# Patient Record
Sex: Female | Born: 1955 | Race: White | Hispanic: No | Smoking: Current every day smoker
Health system: Southern US, Community
[De-identification: ages and names within clinical notes are randomized; demographics above are authoritative.]

## PROBLEM LIST (undated history)

## (undated) DIAGNOSIS — F329 Major depressive disorder, single episode, unspecified: Secondary | ICD-10-CM

## (undated) DIAGNOSIS — K5792 Diverticulitis of intestine, part unspecified, without perforation or abscess without bleeding: Secondary | ICD-10-CM

## (undated) DIAGNOSIS — M545 Other chronic pain: Secondary | ICD-10-CM

## (undated) DIAGNOSIS — E7800 Pure hypercholesterolemia, unspecified: Secondary | ICD-10-CM

## (undated) DIAGNOSIS — J449 Chronic obstructive pulmonary disease, unspecified: Secondary | ICD-10-CM

## (undated) DIAGNOSIS — D49519 Neoplasm of unspecified behavior of unspecified kidney: Secondary | ICD-10-CM

## (undated) DIAGNOSIS — G8929 Other chronic pain: Secondary | ICD-10-CM

## (undated) DIAGNOSIS — N390 Urinary tract infection, site not specified: Secondary | ICD-10-CM

## (undated) DIAGNOSIS — F32A Depression, unspecified: Secondary | ICD-10-CM

## (undated) DIAGNOSIS — K219 Gastro-esophageal reflux disease without esophagitis: Secondary | ICD-10-CM

## (undated) HISTORY — DX: Major depressive disorder, single episode, unspecified: F32.9

## (undated) HISTORY — DX: Low back pain: M54.5

## (undated) HISTORY — DX: Other chronic pain: M54.50

## (undated) HISTORY — DX: Gastro-esophageal reflux disease without esophagitis: K21.9

## (undated) HISTORY — DX: Pure hypercholesterolemia, unspecified: E78.00

## (undated) HISTORY — PX: OVARIAN CYST REMOVAL: SHX89

## (undated) HISTORY — DX: Other chronic pain: G89.29

## (undated) HISTORY — DX: Chronic obstructive pulmonary disease, unspecified: J44.9

## (undated) HISTORY — DX: Depression, unspecified: F32.A

## (undated) HISTORY — DX: Diverticulitis of intestine, part unspecified, without perforation or abscess without bleeding: K57.92

## (undated) HISTORY — DX: Urinary tract infection, site not specified: N39.0

## (undated) HISTORY — PX: APPENDECTOMY: SHX54

## (undated) HISTORY — PX: CYSTECTOMY: SUR359

## (undated) HISTORY — PX: TUBAL LIGATION: SHX77

## (undated) HISTORY — DX: Neoplasm of unspecified behavior of unspecified kidney: D49.519

## (undated) HISTORY — PX: PARTIAL COLECTOMY: SHX5273

---

## 2006-07-16 ENCOUNTER — Encounter: Admission: RE | Admit: 2006-07-16 | Discharge: 2006-08-23 | Payer: Self-pay | Admitting: Family Medicine

## 2006-07-23 ENCOUNTER — Emergency Department (HOSPITAL_COMMUNITY): Admission: EM | Admit: 2006-07-23 | Discharge: 2006-07-23 | Payer: Self-pay | Admitting: Emergency Medicine

## 2006-08-09 ENCOUNTER — Emergency Department (HOSPITAL_COMMUNITY): Admission: EM | Admit: 2006-08-09 | Discharge: 2006-08-09 | Payer: Self-pay | Admitting: Emergency Medicine

## 2006-08-11 ENCOUNTER — Ambulatory Visit: Payer: Self-pay | Admitting: Psychiatry

## 2006-08-11 ENCOUNTER — Inpatient Hospital Stay (HOSPITAL_COMMUNITY): Admission: AD | Admit: 2006-08-11 | Discharge: 2006-08-14 | Payer: Self-pay | Admitting: Psychiatry

## 2006-08-24 ENCOUNTER — Ambulatory Visit (HOSPITAL_COMMUNITY): Admission: RE | Admit: 2006-08-24 | Discharge: 2006-08-24 | Payer: Self-pay | Admitting: Family Medicine

## 2006-09-18 ENCOUNTER — Other Ambulatory Visit: Admission: RE | Admit: 2006-09-18 | Discharge: 2006-09-18 | Payer: Self-pay | Admitting: Family Medicine

## 2006-11-27 ENCOUNTER — Ambulatory Visit: Payer: Self-pay | Admitting: Physical Medicine & Rehabilitation

## 2006-11-27 ENCOUNTER — Encounter
Admission: RE | Admit: 2006-11-27 | Discharge: 2007-02-25 | Payer: Self-pay | Admitting: Physical Medicine & Rehabilitation

## 2006-12-05 ENCOUNTER — Emergency Department (HOSPITAL_COMMUNITY): Admission: EM | Admit: 2006-12-05 | Discharge: 2006-12-05 | Payer: Self-pay | Admitting: Emergency Medicine

## 2006-12-23 ENCOUNTER — Emergency Department (HOSPITAL_COMMUNITY): Admission: EM | Admit: 2006-12-23 | Discharge: 2006-12-23 | Payer: Self-pay | Admitting: Emergency Medicine

## 2007-01-04 ENCOUNTER — Emergency Department (HOSPITAL_COMMUNITY): Admission: EM | Admit: 2007-01-04 | Discharge: 2007-01-06 | Payer: Self-pay | Admitting: Emergency Medicine

## 2007-02-01 ENCOUNTER — Ambulatory Visit: Payer: Self-pay | Admitting: Physical Medicine & Rehabilitation

## 2007-02-06 ENCOUNTER — Ambulatory Visit: Payer: Self-pay | Admitting: Cardiology

## 2007-02-18 ENCOUNTER — Encounter
Admission: RE | Admit: 2007-02-18 | Discharge: 2007-03-20 | Payer: Self-pay | Admitting: Physical Medicine & Rehabilitation

## 2007-02-21 ENCOUNTER — Encounter: Payer: Self-pay | Admitting: Internal Medicine

## 2007-02-21 ENCOUNTER — Ambulatory Visit: Payer: Self-pay

## 2007-02-23 ENCOUNTER — Emergency Department (HOSPITAL_COMMUNITY): Admission: EM | Admit: 2007-02-23 | Discharge: 2007-02-23 | Payer: Self-pay | Admitting: Emergency Medicine

## 2007-03-02 ENCOUNTER — Emergency Department (HOSPITAL_COMMUNITY): Admission: EM | Admit: 2007-03-02 | Discharge: 2007-03-02 | Payer: Self-pay | Admitting: Emergency Medicine

## 2007-03-20 ENCOUNTER — Ambulatory Visit: Payer: Self-pay | Admitting: Cardiology

## 2007-04-07 ENCOUNTER — Emergency Department (HOSPITAL_COMMUNITY): Admission: EM | Admit: 2007-04-07 | Discharge: 2007-04-07 | Payer: Self-pay | Admitting: Emergency Medicine

## 2007-04-15 ENCOUNTER — Emergency Department (HOSPITAL_COMMUNITY): Admission: EM | Admit: 2007-04-15 | Discharge: 2007-04-16 | Payer: Self-pay | Admitting: Emergency Medicine

## 2007-05-01 ENCOUNTER — Ambulatory Visit: Payer: Self-pay | Admitting: Cardiology

## 2007-05-30 ENCOUNTER — Emergency Department (HOSPITAL_COMMUNITY): Admission: EM | Admit: 2007-05-30 | Discharge: 2007-05-30 | Payer: Self-pay | Admitting: Emergency Medicine

## 2007-06-01 ENCOUNTER — Emergency Department (HOSPITAL_COMMUNITY): Admission: EM | Admit: 2007-06-01 | Discharge: 2007-06-01 | Payer: Self-pay | Admitting: Emergency Medicine

## 2007-06-02 ENCOUNTER — Emergency Department (HOSPITAL_COMMUNITY): Admission: EM | Admit: 2007-06-02 | Discharge: 2007-06-02 | Payer: Self-pay | Admitting: Emergency Medicine

## 2007-06-10 ENCOUNTER — Emergency Department (HOSPITAL_COMMUNITY): Admission: EM | Admit: 2007-06-10 | Discharge: 2007-06-10 | Payer: Self-pay | Admitting: Emergency Medicine

## 2007-07-16 ENCOUNTER — Ambulatory Visit (HOSPITAL_COMMUNITY): Admission: RE | Admit: 2007-07-16 | Discharge: 2007-07-16 | Payer: Self-pay | Admitting: Cardiovascular Disease

## 2007-07-16 ENCOUNTER — Ambulatory Visit: Payer: Self-pay | Admitting: Cardiovascular Disease

## 2007-08-22 ENCOUNTER — Ambulatory Visit: Payer: Self-pay | Admitting: Physical Medicine & Rehabilitation

## 2007-08-22 ENCOUNTER — Encounter
Admission: RE | Admit: 2007-08-22 | Discharge: 2007-08-23 | Payer: Self-pay | Admitting: Physical Medicine & Rehabilitation

## 2007-09-01 ENCOUNTER — Emergency Department (HOSPITAL_COMMUNITY): Admission: EM | Admit: 2007-09-01 | Discharge: 2007-09-01 | Payer: Self-pay | Admitting: Emergency Medicine

## 2007-09-26 ENCOUNTER — Observation Stay (HOSPITAL_COMMUNITY): Admission: EM | Admit: 2007-09-26 | Discharge: 2007-09-27 | Payer: Self-pay | Admitting: Emergency Medicine

## 2007-09-26 ENCOUNTER — Ambulatory Visit: Payer: Self-pay | Admitting: Cardiology

## 2007-12-03 ENCOUNTER — Emergency Department (HOSPITAL_COMMUNITY): Admission: EM | Admit: 2007-12-03 | Discharge: 2007-12-03 | Payer: Self-pay | Admitting: Emergency Medicine

## 2007-12-19 ENCOUNTER — Encounter (HOSPITAL_COMMUNITY)
Admission: RE | Admit: 2007-12-19 | Discharge: 2008-01-18 | Payer: Self-pay | Admitting: Physical Medicine & Rehabilitation

## 2007-12-23 ENCOUNTER — Encounter
Admission: RE | Admit: 2007-12-23 | Discharge: 2008-03-22 | Payer: Self-pay | Admitting: Physical Medicine & Rehabilitation

## 2007-12-23 ENCOUNTER — Ambulatory Visit: Payer: Self-pay | Admitting: Physical Medicine & Rehabilitation

## 2008-01-06 ENCOUNTER — Ambulatory Visit (HOSPITAL_COMMUNITY)
Admission: RE | Admit: 2008-01-06 | Discharge: 2008-01-06 | Payer: Self-pay | Admitting: Physical Medicine & Rehabilitation

## 2008-01-24 ENCOUNTER — Ambulatory Visit: Payer: Self-pay | Admitting: Physical Medicine & Rehabilitation

## 2008-03-12 ENCOUNTER — Ambulatory Visit (HOSPITAL_COMMUNITY): Admission: RE | Admit: 2008-03-12 | Discharge: 2008-03-12 | Payer: Self-pay | Admitting: Cardiovascular Disease

## 2008-03-12 ENCOUNTER — Ambulatory Visit: Payer: Self-pay | Admitting: Cardiovascular Disease

## 2008-03-20 ENCOUNTER — Inpatient Hospital Stay (HOSPITAL_COMMUNITY): Admission: AD | Admit: 2008-03-20 | Discharge: 2008-03-24 | Payer: Self-pay | Admitting: Cardiology

## 2008-03-20 ENCOUNTER — Ambulatory Visit: Payer: Self-pay | Admitting: Cardiovascular Disease

## 2008-04-29 ENCOUNTER — Encounter
Admission: RE | Admit: 2008-04-29 | Discharge: 2008-04-29 | Payer: Self-pay | Admitting: Physical Medicine & Rehabilitation

## 2008-04-29 ENCOUNTER — Ambulatory Visit: Payer: Self-pay | Admitting: Physical Medicine & Rehabilitation

## 2008-06-22 ENCOUNTER — Emergency Department (HOSPITAL_COMMUNITY): Admission: EM | Admit: 2008-06-22 | Discharge: 2008-06-22 | Payer: Self-pay | Admitting: Emergency Medicine

## 2008-07-16 ENCOUNTER — Emergency Department (HOSPITAL_COMMUNITY): Admission: EM | Admit: 2008-07-16 | Discharge: 2008-07-16 | Payer: Self-pay | Admitting: Emergency Medicine

## 2008-07-20 ENCOUNTER — Encounter
Admission: RE | Admit: 2008-07-20 | Discharge: 2008-10-18 | Payer: Self-pay | Admitting: Physical Medicine & Rehabilitation

## 2008-07-23 ENCOUNTER — Emergency Department (HOSPITAL_COMMUNITY): Admission: EM | Admit: 2008-07-23 | Discharge: 2008-07-23 | Payer: Self-pay | Admitting: Emergency Medicine

## 2008-08-24 ENCOUNTER — Ambulatory Visit: Payer: Self-pay | Admitting: Physical Medicine & Rehabilitation

## 2008-09-04 ENCOUNTER — Encounter: Payer: Self-pay | Admitting: Orthopedic Surgery

## 2008-09-04 ENCOUNTER — Emergency Department (HOSPITAL_COMMUNITY): Admission: EM | Admit: 2008-09-04 | Discharge: 2008-09-05 | Payer: Self-pay | Admitting: Emergency Medicine

## 2008-09-09 ENCOUNTER — Ambulatory Visit (HOSPITAL_COMMUNITY): Admission: RE | Admit: 2008-09-09 | Discharge: 2008-09-09 | Payer: Self-pay | Admitting: Family Medicine

## 2008-09-25 ENCOUNTER — Emergency Department (HOSPITAL_COMMUNITY): Admission: EM | Admit: 2008-09-25 | Discharge: 2008-09-25 | Payer: Self-pay | Admitting: Emergency Medicine

## 2008-09-25 ENCOUNTER — Encounter
Admission: RE | Admit: 2008-09-25 | Discharge: 2008-09-25 | Payer: Self-pay | Admitting: Physical Medicine & Rehabilitation

## 2008-11-03 ENCOUNTER — Emergency Department (HOSPITAL_COMMUNITY): Admission: EM | Admit: 2008-11-03 | Discharge: 2008-11-03 | Payer: Self-pay | Admitting: Emergency Medicine

## 2008-11-26 ENCOUNTER — Emergency Department (HOSPITAL_COMMUNITY): Admission: EM | Admit: 2008-11-26 | Discharge: 2008-11-26 | Payer: Self-pay | Admitting: Emergency Medicine

## 2008-12-02 ENCOUNTER — Emergency Department (HOSPITAL_COMMUNITY): Admission: EM | Admit: 2008-12-02 | Discharge: 2008-12-02 | Payer: Self-pay | Admitting: Emergency Medicine

## 2008-12-02 ENCOUNTER — Encounter: Payer: Self-pay | Admitting: Orthopedic Surgery

## 2008-12-03 ENCOUNTER — Ambulatory Visit: Payer: Self-pay | Admitting: Orthopedic Surgery

## 2009-01-09 ENCOUNTER — Emergency Department (HOSPITAL_COMMUNITY): Admission: EM | Admit: 2009-01-09 | Discharge: 2009-01-09 | Payer: Self-pay | Admitting: Emergency Medicine

## 2009-02-25 ENCOUNTER — Other Ambulatory Visit: Admission: RE | Admit: 2009-02-25 | Discharge: 2009-02-25 | Payer: Self-pay | Admitting: Obstetrics & Gynecology

## 2009-06-10 IMAGING — CR DG CHEST 2V
2 series · 2 of 2 positions shown · non-contrast
Comparison: none

CLINICAL DATA: Cough and congestion.  
 PA AND LATERAL CHEST - 2 VIEW:

[view not recorded (1 of 2)]
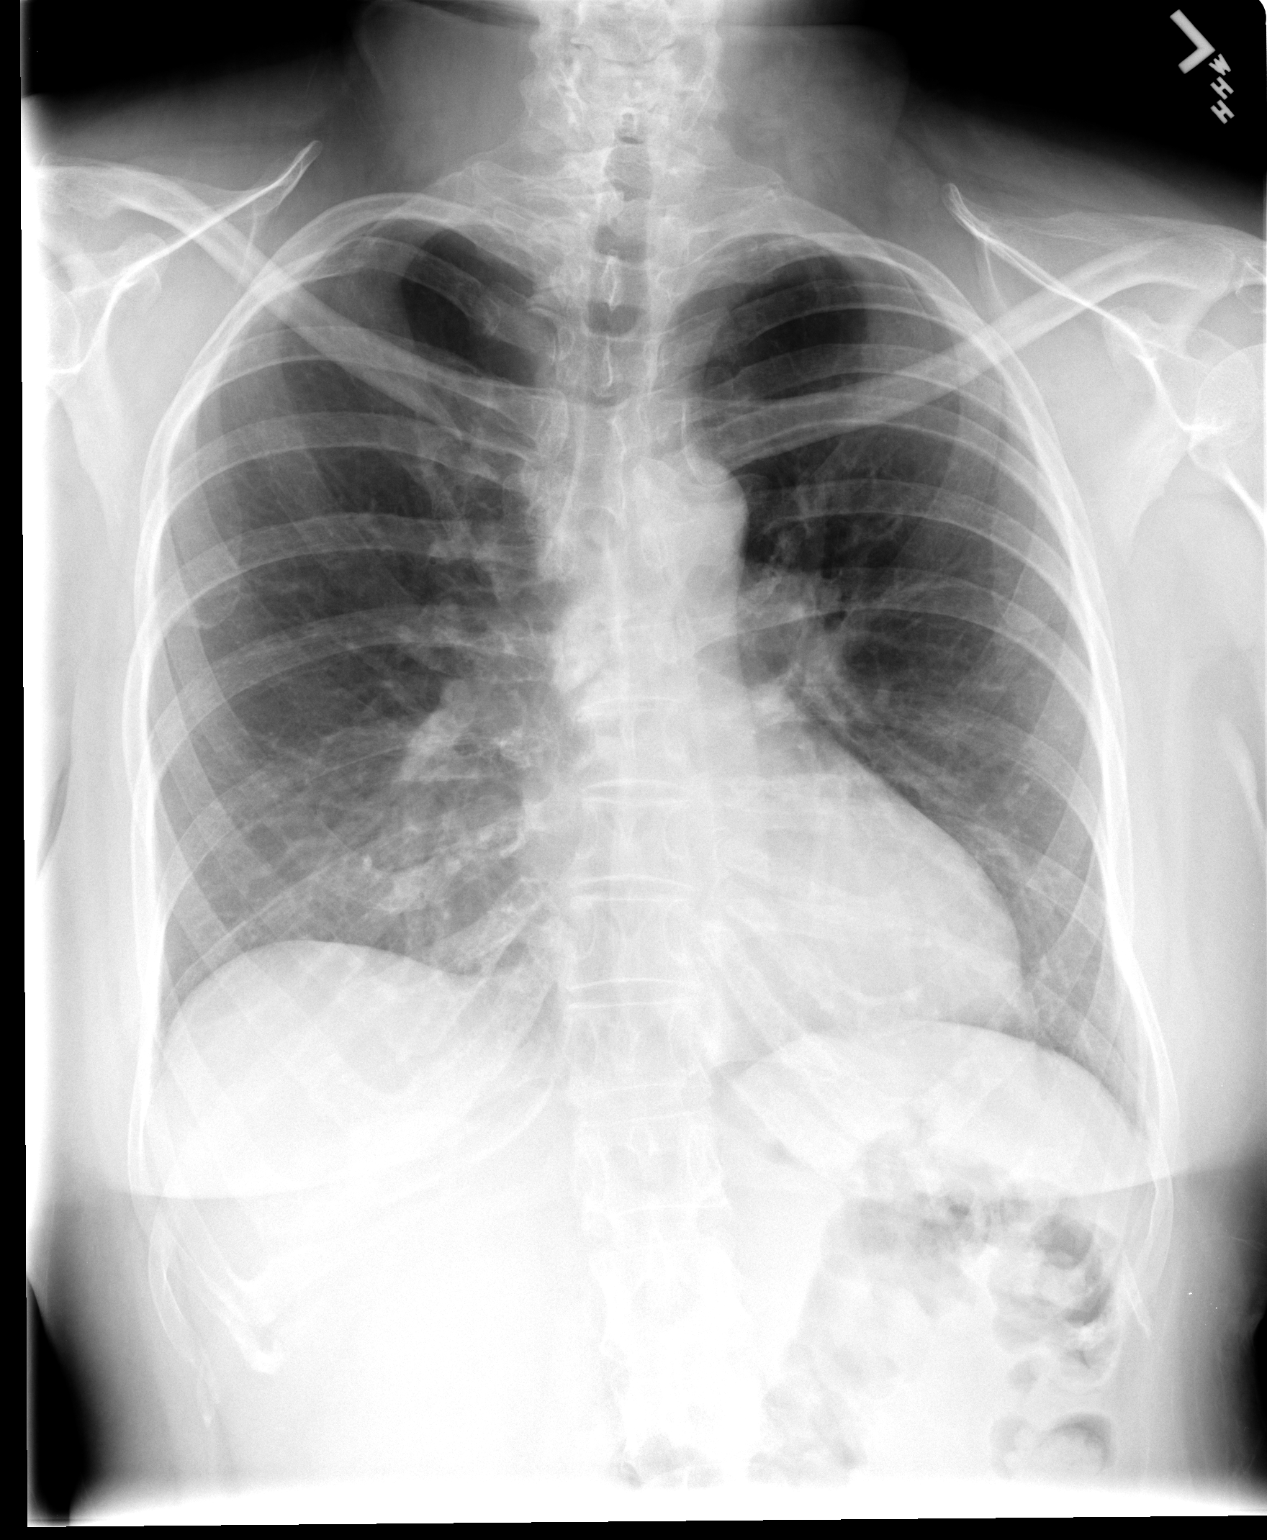

[view not recorded (2 of 2)]
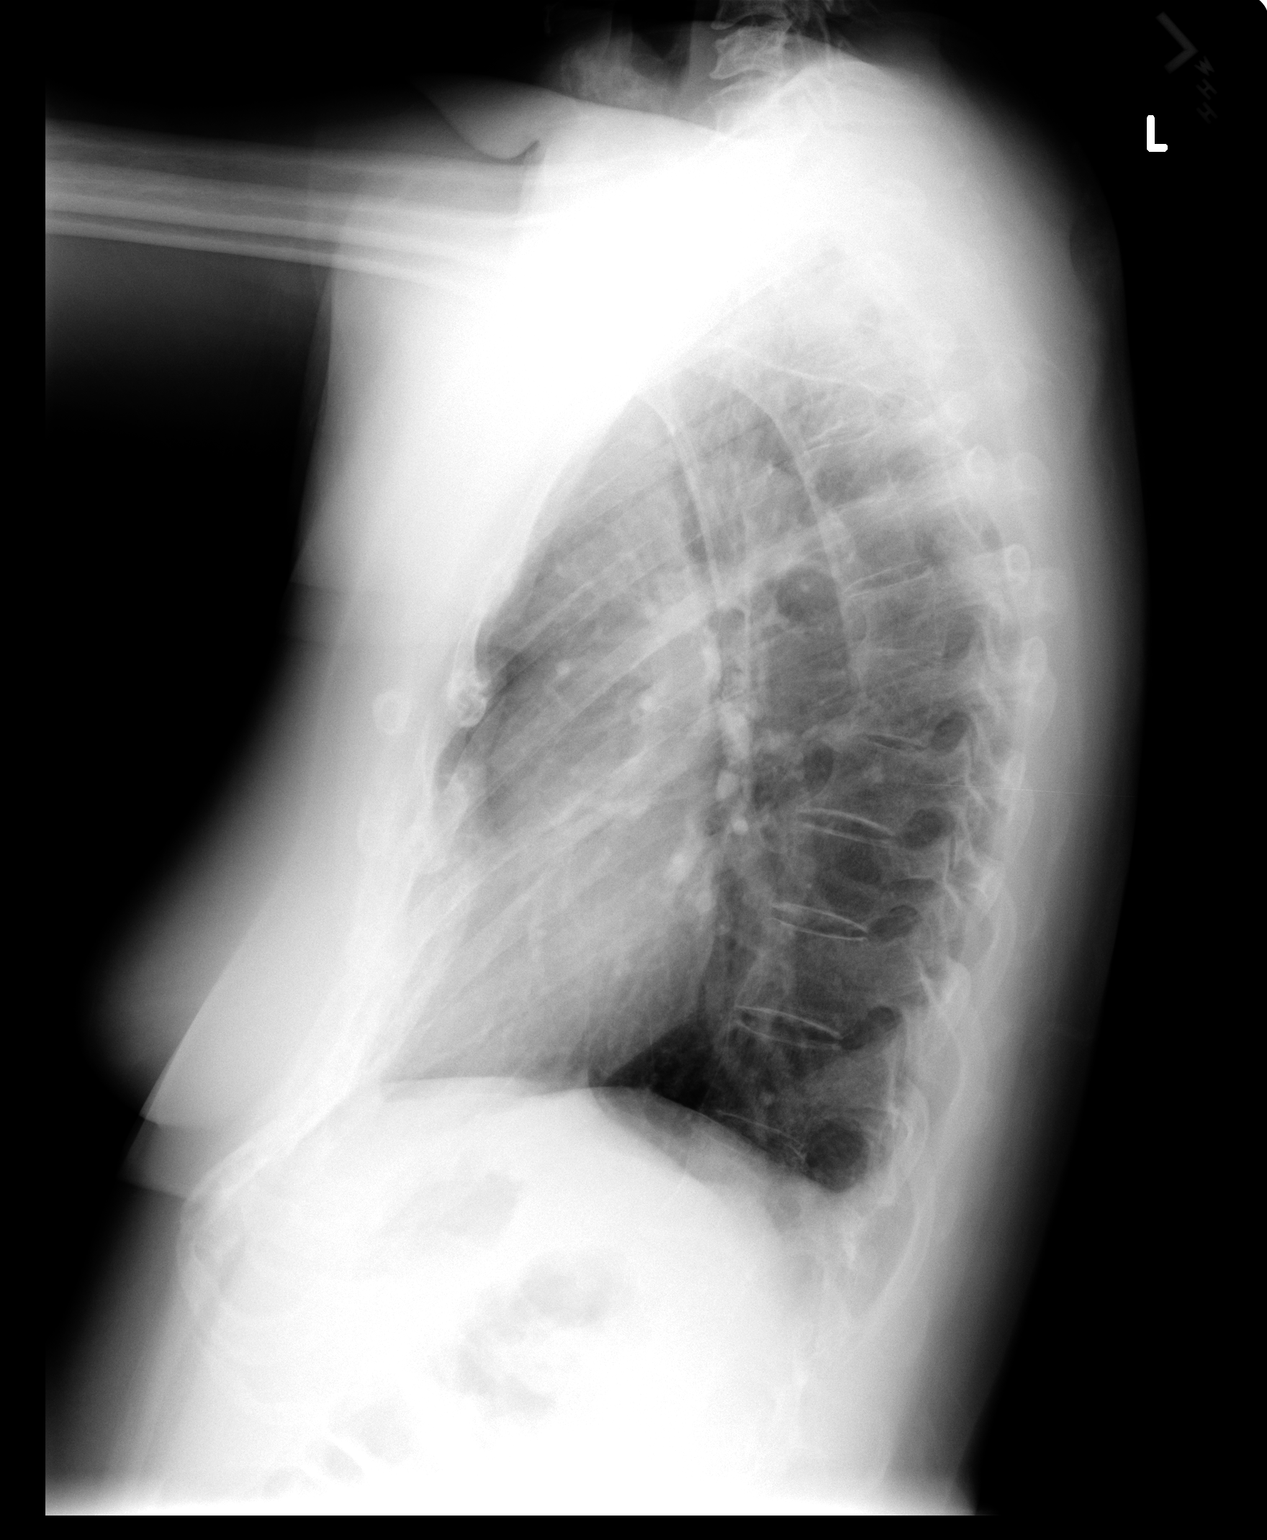

[2 of 2 positions shown; findings below may reference images not displayed]

FINDINGS: The lungs are clear.  Heart size is normal.  Patient has a pectus deformity.  There is also some mild focal kyphosis within the mid thoracic spine but no fracture is identified.
IMPRESSION: No acute cardiopulmonary disease.

## 2009-06-30 ENCOUNTER — Emergency Department (HOSPITAL_COMMUNITY): Admission: EM | Admit: 2009-06-30 | Discharge: 2009-06-30 | Payer: Self-pay | Admitting: Emergency Medicine

## 2009-07-29 ENCOUNTER — Emergency Department (HOSPITAL_COMMUNITY): Admission: EM | Admit: 2009-07-29 | Discharge: 2009-07-29 | Payer: Self-pay | Admitting: Emergency Medicine

## 2009-09-24 ENCOUNTER — Emergency Department (HOSPITAL_COMMUNITY): Admission: EM | Admit: 2009-09-24 | Discharge: 2009-09-24 | Payer: Self-pay | Admitting: Emergency Medicine

## 2009-10-13 ENCOUNTER — Emergency Department (HOSPITAL_COMMUNITY): Admission: EM | Admit: 2009-10-13 | Discharge: 2009-10-13 | Payer: Self-pay | Admitting: Emergency Medicine

## 2009-10-21 ENCOUNTER — Emergency Department (HOSPITAL_COMMUNITY): Admission: EM | Admit: 2009-10-21 | Discharge: 2009-10-21 | Payer: Self-pay | Admitting: Emergency Medicine

## 2009-10-22 ENCOUNTER — Emergency Department (HOSPITAL_COMMUNITY): Admission: EM | Admit: 2009-10-22 | Discharge: 2009-10-22 | Payer: Self-pay | Admitting: Emergency Medicine

## 2009-11-11 ENCOUNTER — Emergency Department (HOSPITAL_COMMUNITY): Admission: EM | Admit: 2009-11-11 | Discharge: 2009-11-11 | Payer: Self-pay | Admitting: Emergency Medicine

## 2009-11-19 ENCOUNTER — Emergency Department (HOSPITAL_COMMUNITY): Admission: EM | Admit: 2009-11-19 | Discharge: 2009-11-19 | Payer: Self-pay | Admitting: Emergency Medicine

## 2009-11-22 ENCOUNTER — Emergency Department (HOSPITAL_COMMUNITY): Admission: EM | Admit: 2009-11-22 | Discharge: 2009-11-22 | Payer: Self-pay | Admitting: Emergency Medicine

## 2009-11-24 ENCOUNTER — Emergency Department (HOSPITAL_COMMUNITY): Admission: EM | Admit: 2009-11-24 | Discharge: 2009-11-24 | Payer: Self-pay | Admitting: Emergency Medicine

## 2009-12-02 ENCOUNTER — Emergency Department (HOSPITAL_COMMUNITY): Admission: EM | Admit: 2009-12-02 | Discharge: 2009-12-02 | Payer: Self-pay | Admitting: Emergency Medicine

## 2009-12-07 ENCOUNTER — Emergency Department (HOSPITAL_COMMUNITY): Admission: EM | Admit: 2009-12-07 | Discharge: 2009-12-07 | Payer: Self-pay | Admitting: Emergency Medicine

## 2009-12-28 ENCOUNTER — Emergency Department (HOSPITAL_COMMUNITY): Admission: EM | Admit: 2009-12-28 | Discharge: 2009-12-28 | Payer: Self-pay | Admitting: Emergency Medicine

## 2010-01-25 ENCOUNTER — Inpatient Hospital Stay (HOSPITAL_COMMUNITY): Admission: RE | Admit: 2010-01-25 | Discharge: 2010-01-29 | Payer: Self-pay | Admitting: Psychiatry

## 2010-01-25 ENCOUNTER — Other Ambulatory Visit: Payer: Self-pay | Admitting: Emergency Medicine

## 2010-01-25 ENCOUNTER — Other Ambulatory Visit: Payer: Self-pay

## 2010-01-25 ENCOUNTER — Ambulatory Visit: Payer: Self-pay | Admitting: Psychiatry

## 2010-02-05 ENCOUNTER — Emergency Department (HOSPITAL_COMMUNITY): Admission: EM | Admit: 2010-02-05 | Discharge: 2010-02-05 | Payer: Self-pay | Admitting: Diagnostic Radiology

## 2010-02-14 ENCOUNTER — Emergency Department (HOSPITAL_COMMUNITY): Admission: EM | Admit: 2010-02-14 | Discharge: 2010-02-14 | Payer: Self-pay | Admitting: Emergency Medicine

## 2010-03-07 ENCOUNTER — Ambulatory Visit: Payer: Self-pay | Admitting: Cardiovascular Disease

## 2010-03-07 DIAGNOSIS — R9431 Abnormal electrocardiogram [ECG] [EKG]: Secondary | ICD-10-CM

## 2010-03-07 DIAGNOSIS — R079 Chest pain, unspecified: Secondary | ICD-10-CM

## 2010-03-08 ENCOUNTER — Encounter: Payer: Self-pay | Admitting: Cardiovascular Disease

## 2010-03-08 ENCOUNTER — Emergency Department (HOSPITAL_COMMUNITY): Admission: EM | Admit: 2010-03-08 | Discharge: 2010-03-08 | Payer: Self-pay | Admitting: Emergency Medicine

## 2010-03-29 ENCOUNTER — Emergency Department (HOSPITAL_COMMUNITY): Admission: EM | Admit: 2010-03-29 | Discharge: 2010-03-29 | Payer: Self-pay | Admitting: Emergency Medicine

## 2010-04-23 ENCOUNTER — Emergency Department (HOSPITAL_COMMUNITY): Admission: EM | Admit: 2010-04-23 | Discharge: 2010-04-23 | Payer: Self-pay | Admitting: Emergency Medicine

## 2010-12-11 ENCOUNTER — Encounter: Payer: Self-pay | Admitting: Cardiovascular Disease

## 2010-12-11 ENCOUNTER — Encounter: Payer: Self-pay | Admitting: Physical Medicine & Rehabilitation

## 2010-12-12 ENCOUNTER — Encounter: Payer: Self-pay | Admitting: Family Medicine

## 2010-12-19 DIAGNOSIS — K5900 Constipation, unspecified: Secondary | ICD-10-CM | POA: Insufficient documentation

## 2010-12-19 DIAGNOSIS — G4700 Insomnia, unspecified: Secondary | ICD-10-CM | POA: Insufficient documentation

## 2010-12-19 DIAGNOSIS — F329 Major depressive disorder, single episode, unspecified: Secondary | ICD-10-CM | POA: Insufficient documentation

## 2010-12-20 NOTE — Letter (Signed)
Summary: ekg 01-2010  ekg 01-2010   Imported By: Faythe Ghee 03/08/2010 12:03:57  _____________________________________________________________________  External Attachment:    Type:   Image     Comment:   External Document

## 2010-12-20 NOTE — Letter (Signed)
Summary: Work Writer at [employer]  [address] [place] [address]   Phone: [phone]  Fax: [phone]     March 07, 2010    [facility]   The above named patient had a medical visit today at:   10:45   am.  Please take this into consideration when reviewing the time away from work/school.      Sincerely yours,  Architectural technologist
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
## 2010-12-20 NOTE — Letter (Signed)
Summary: progress note dr tysinger  progress note dr tysinger   Imported By: Faythe Ghee 03/08/2010 12:03:33  _____________________________________________________________________  External Attachment:    Type:   Image     Comment:   External Document

## 2011-01-26 DIAGNOSIS — D3000 Benign neoplasm of unspecified kidney: Secondary | ICD-10-CM | POA: Insufficient documentation

## 2011-02-05 LAB — URINALYSIS, ROUTINE W REFLEX MICROSCOPIC
Bilirubin Urine: NEGATIVE
Bilirubin Urine: NEGATIVE
Glucose, UA: NEGATIVE mg/dL
Glucose, UA: NEGATIVE mg/dL
Ketones, ur: NEGATIVE mg/dL
Nitrite: NEGATIVE
Nitrite: NEGATIVE
Nitrite: NEGATIVE
Protein, ur: NEGATIVE mg/dL
Specific Gravity, Urine: 1.01 (ref 1.005–1.030)
Specific Gravity, Urine: 1.015 (ref 1.005–1.030)
pH: 5.5 (ref 5.0–8.0)
pH: 7 (ref 5.0–8.0)

## 2011-02-05 LAB — POCT I-STAT, CHEM 8
BUN: 7 mg/dL (ref 6–23)
Calcium, Ion: 1.17 mmol/L (ref 1.12–1.32)
Chloride: 104 mEq/L (ref 96–112)
Chloride: 104 mEq/L (ref 96–112)
Creatinine, Ser: 0.6 mg/dL (ref 0.4–1.2)
Glucose, Bld: 86 mg/dL (ref 70–99)
Glucose, Bld: 89 mg/dL (ref 70–99)
HCT: 36 % (ref 36.0–46.0)
HCT: 42 % (ref 36.0–46.0)
Potassium: 4 mEq/L (ref 3.5–5.1)

## 2011-02-05 LAB — CBC
HCT: 44.4 % (ref 36.0–46.0)
MCHC: 33.4 g/dL (ref 30.0–36.0)
MCV: 90.4 fL (ref 78.0–100.0)
Platelets: 316 10*3/uL (ref 150–400)
RDW: 11.9 % (ref 11.5–15.5)

## 2011-02-05 LAB — URINE MICROSCOPIC-ADD ON

## 2011-02-05 LAB — BASIC METABOLIC PANEL
BUN: 9 mg/dL (ref 6–23)
CO2: 30 mEq/L (ref 19–32)
Chloride: 100 mEq/L (ref 96–112)
Creatinine, Ser: 1 mg/dL (ref 0.4–1.2)

## 2011-02-05 LAB — DIFFERENTIAL
Basophils Absolute: 0 10*3/uL (ref 0.0–0.1)
Basophils Relative: 0 % (ref 0–1)
Eosinophils Absolute: 0.1 10*3/uL (ref 0.0–0.7)
Eosinophils Relative: 1 % (ref 0–5)

## 2011-02-05 LAB — POCT CARDIAC MARKERS

## 2011-02-06 LAB — URINALYSIS, ROUTINE W REFLEX MICROSCOPIC
Bilirubin Urine: NEGATIVE
Ketones, ur: NEGATIVE mg/dL
Nitrite: NEGATIVE
Specific Gravity, Urine: 1.02 (ref 1.005–1.030)
Urobilinogen, UA: 0.2 mg/dL (ref 0.0–1.0)

## 2011-02-06 LAB — URINE CULTURE: Colony Count: 6000

## 2011-02-07 LAB — DIFFERENTIAL
Basophils Absolute: 0 K/uL (ref 0.0–0.1)
Basophils Relative: 0 % (ref 0–1)
Eosinophils Absolute: 0.1 10*3/uL (ref 0.0–0.7)
Eosinophils Relative: 1 % (ref 0–5)
Lymphocytes Relative: 37 % (ref 12–46)
Lymphs Abs: 2.9 10*3/uL (ref 0.7–4.0)
Monocytes Absolute: 0.5 K/uL (ref 0.1–1.0)
Monocytes Relative: 7 % (ref 3–12)
Neutro Abs: 4.2 K/uL (ref 1.7–7.7)
Neutrophils Relative %: 55 % (ref 43–77)

## 2011-02-07 LAB — URINALYSIS, ROUTINE W REFLEX MICROSCOPIC
Bilirubin Urine: NEGATIVE
Glucose, UA: NEGATIVE mg/dL
Hgb urine dipstick: NEGATIVE
Ketones, ur: NEGATIVE mg/dL
Nitrite: NEGATIVE
Nitrite: NEGATIVE
Protein, ur: NEGATIVE mg/dL
Specific Gravity, Urine: 1.01 (ref 1.005–1.030)
Specific Gravity, Urine: <1.005 — ABNORMAL LOW (ref 1.005–1.030)
Urobilinogen, UA: 0.2 mg/dL (ref 0.0–1.0)
pH: 6 (ref 5.0–8.0)
pH: 7 (ref 5.0–8.0)

## 2011-02-07 LAB — CBC
HCT: 40 % (ref 36.0–46.0)
Hemoglobin: 14 g/dL (ref 12.0–15.0)
MCHC: 35 g/dL (ref 30.0–36.0)
MCV: 89.8 fL (ref 78.0–100.0)
Platelets: 264 K/uL (ref 150–400)
RBC: 4.46 MIL/uL (ref 3.87–5.11)
RDW: 12 % (ref 11.5–15.5)
WBC: 7.7 10*3/uL (ref 4.0–10.5)

## 2011-02-07 LAB — LIPASE, BLOOD: Lipase: 51 U/L (ref 11–59)

## 2011-02-07 LAB — COMPREHENSIVE METABOLIC PANEL WITH GFR
Albumin: 4.2 g/dL (ref 3.5–5.2)
Alkaline Phosphatase: 81 U/L (ref 39–117)
BUN: 9 mg/dL (ref 6–23)
Creatinine, Ser: 0.85 mg/dL (ref 0.4–1.2)
Glucose, Bld: 107 mg/dL — ABNORMAL HIGH (ref 70–99)
Potassium: 4.9 meq/L (ref 3.5–5.1)
Total Bilirubin: 0.5 mg/dL (ref 0.3–1.2)
Total Protein: 6.9 g/dL (ref 6.0–8.3)

## 2011-02-07 LAB — COMPREHENSIVE METABOLIC PANEL
ALT: 15 U/L (ref 0–35)
AST: 19 U/L (ref 0–37)
CO2: 31 mEq/L (ref 19–32)
Calcium: 9.9 mg/dL (ref 8.4–10.5)
Chloride: 102 mEq/L (ref 96–112)
GFR calc Af Amer: >60 mL/min (ref >60)
GFR calc non Af Amer: >60 mL/min (ref >60)
Sodium: 137 mEq/L (ref 135–145)

## 2011-02-07 LAB — URINE CULTURE: Colony Count: 8000

## 2011-02-07 LAB — URINE MICROSCOPIC-ADD ON

## 2011-02-07 LAB — POCT CARDIAC MARKERS
CKMB, poc: <1 ng/mL — ABNORMAL LOW (ref 1.0–8.0)
Myoglobin, poc: 57.4 ng/mL (ref 12–200)
Troponin i, poc: <0.05 ng/mL (ref 0.00–0.09)

## 2011-02-07 LAB — GC/CHLAMYDIA PROBE AMP, GENITAL: Chlamydia, DNA Probe: NEGATIVE

## 2011-02-08 LAB — URINE CULTURE: Colony Count: 4000

## 2011-02-08 LAB — URINALYSIS, ROUTINE W REFLEX MICROSCOPIC
Bilirubin Urine: NEGATIVE
Glucose, UA: NEGATIVE mg/dL
Hgb urine dipstick: NEGATIVE
Protein, ur: NEGATIVE mg/dL
Urobilinogen, UA: 0.2 mg/dL (ref 0.0–1.0)

## 2011-02-12 LAB — URINE CULTURE
Colony Count: NO GROWTH
Culture: NO GROWTH
Special Requests: NEGATIVE

## 2011-02-12 LAB — BASIC METABOLIC PANEL
BUN: 9 mg/dL (ref 6–23)
BUN: 9 mg/dL (ref 6–23)
CO2: 25 mEq/L (ref 19–32)
CO2: 28 mEq/L (ref 19–32)
GFR calc non Af Amer: >60 mL/min (ref >60)
GFR calc non Af Amer: >60 mL/min (ref >60)
Glucose, Bld: 94 mg/dL (ref 70–99)
Glucose, Bld: 95 mg/dL (ref 70–99)
Potassium: 3.3 mEq/L — ABNORMAL LOW (ref 3.5–5.1)
Potassium: 4.6 mEq/L (ref 3.5–5.1)
Sodium: 134 mEq/L — ABNORMAL LOW (ref 135–145)

## 2011-02-12 LAB — URINE MICROSCOPIC-ADD ON

## 2011-02-12 LAB — CBC
HCT: 36.2 % (ref 36.0–46.0)
HCT: 41.5 % (ref 36.0–46.0)
Hemoglobin: 14.5 g/dL (ref 12.0–15.0)
MCHC: 34.8 g/dL (ref 30.0–36.0)
MCHC: 35.7 g/dL (ref 30.0–36.0)
MCV: 87.6 fL (ref 78.0–100.0)
MCV: 88.1 fL (ref 78.0–100.0)
Platelets: 270 10*3/uL (ref 150–400)
Platelets: 272 10*3/uL (ref 150–400)
RDW: 12 % (ref 11.5–15.5)
RDW: 12.1 % (ref 11.5–15.5)

## 2011-02-12 LAB — RAPID URINE DRUG SCREEN, HOSP PERFORMED
Amphetamines: NOT DETECTED
Benzodiazepines: POSITIVE — AB
Opiates: NOT DETECTED
Tetrahydrocannabinol: NOT DETECTED

## 2011-02-12 LAB — LIPASE, BLOOD: Lipase: 24 U/L (ref 11–59)

## 2011-02-12 LAB — D-DIMER, QUANTITATIVE: D-Dimer, Quant: 0.32 ug/mL-FEU (ref 0.00–0.48)

## 2011-02-12 LAB — DIFFERENTIAL
Basophils Absolute: 0 10*3/uL (ref 0.0–0.1)
Basophils Absolute: 0 10*3/uL (ref 0.0–0.1)
Basophils Relative: 1 % (ref 0–1)
Basophils Relative: 1 % (ref 0–1)
Eosinophils Absolute: 0.1 10*3/uL (ref 0.0–0.7)
Eosinophils Absolute: 0.1 10*3/uL (ref 0.0–0.7)
Eosinophils Relative: 1 % (ref 0–5)
Eosinophils Relative: 2 % (ref 0–5)
Lymphocytes Relative: 39 % (ref 12–46)
Monocytes Absolute: 0.5 10*3/uL (ref 0.1–1.0)

## 2011-02-12 LAB — URINALYSIS, ROUTINE W REFLEX MICROSCOPIC
Bilirubin Urine: NEGATIVE
Nitrite: NEGATIVE
Protein, ur: NEGATIVE mg/dL
Specific Gravity, Urine: 1.01 (ref 1.005–1.030)
Urobilinogen, UA: 0.2 mg/dL (ref 0.0–1.0)

## 2011-02-12 LAB — POCT CARDIAC MARKERS
CKMB, poc: <1 ng/mL — ABNORMAL LOW (ref 1.0–8.0)
Myoglobin, poc: 52.6 ng/mL (ref 12–200)

## 2011-02-13 LAB — CK TOTAL AND CKMB (NOT AT ARMC)
CK, MB: 1 ng/mL (ref 0.3–4.0)
Relative Index: INVALID (ref 0.0–2.5)
Total CK: 60 U/L (ref 7–177)

## 2011-02-13 LAB — URINALYSIS, ROUTINE W REFLEX MICROSCOPIC
Bilirubin Urine: NEGATIVE
Hgb urine dipstick: NEGATIVE
Specific Gravity, Urine: 1.007 (ref 1.005–1.030)
pH: 5.5 (ref 5.0–8.0)

## 2011-02-13 LAB — DIFFERENTIAL
Basophils Absolute: 0 10*3/uL (ref 0.0–0.1)
Basophils Relative: 1 % (ref 0–1)
Eosinophils Absolute: 0.3 10*3/uL (ref 0.0–0.7)
Monocytes Relative: 7 % (ref 3–12)
Neutro Abs: 3.6 10*3/uL (ref 1.7–7.7)
Neutrophils Relative %: 44 % (ref 43–77)

## 2011-02-13 LAB — CBC
MCHC: 32.7 g/dL (ref 30.0–36.0)
MCV: 92 fL (ref 78.0–100.0)
Platelets: 251 10*3/uL (ref 150–400)
RBC: 4.54 MIL/uL (ref 3.87–5.11)
WBC: 8.3 10*3/uL (ref 4.0–10.5)

## 2011-02-13 LAB — URINE MICROSCOPIC-ADD ON

## 2011-02-13 LAB — RAPID URINE DRUG SCREEN, HOSP PERFORMED
Barbiturates: NOT DETECTED
Opiates: NOT DETECTED

## 2011-02-13 LAB — BASIC METABOLIC PANEL
BUN: 8 mg/dL (ref 6–23)
CO2: 25 mEq/L (ref 19–32)
Calcium: 9.1 mg/dL (ref 8.4–10.5)
Chloride: 103 mEq/L (ref 96–112)
Creatinine, Ser: 0.74 mg/dL (ref 0.4–1.2)
GFR calc Af Amer: >60 mL/min (ref >60)

## 2011-02-13 LAB — ETHANOL: Alcohol, Ethyl (B): <5 mg/dL (ref 0–10)

## 2011-02-20 LAB — URINALYSIS, ROUTINE W REFLEX MICROSCOPIC
Bilirubin Urine: NEGATIVE
Bilirubin Urine: NEGATIVE
Glucose, UA: NEGATIVE mg/dL
Glucose, UA: NEGATIVE mg/dL
Hgb urine dipstick: NEGATIVE
Hgb urine dipstick: NEGATIVE
Ketones, ur: NEGATIVE mg/dL
Ketones, ur: NEGATIVE mg/dL
Protein, ur: NEGATIVE mg/dL
Protein, ur: NEGATIVE mg/dL
Urobilinogen, UA: 0.2 mg/dL (ref 0.0–1.0)
pH: 6 (ref 5.0–8.0)

## 2011-02-21 LAB — POCT CARDIAC MARKERS
CKMB, poc: 1.6 ng/mL (ref 1.0–8.0)
CKMB, poc: 1.9 ng/mL (ref 1.0–8.0)
Myoglobin, poc: 76.3 ng/mL (ref 12–200)
Troponin i, poc: <0.05 ng/mL (ref 0.00–0.09)
Troponin i, poc: <0.05 ng/mL (ref 0.00–0.09)

## 2011-02-21 LAB — CBC
HCT: 46.1 % — ABNORMAL HIGH (ref 36.0–46.0)
Hemoglobin: 15.8 g/dL — ABNORMAL HIGH (ref 12.0–15.0)
MCHC: 34.2 g/dL (ref 30.0–36.0)
RBC: 5.15 MIL/uL — ABNORMAL HIGH (ref 3.87–5.11)

## 2011-02-21 LAB — BASIC METABOLIC PANEL
CO2: 30 mEq/L (ref 19–32)
Calcium: 10 mg/dL (ref 8.4–10.5)
GFR calc Af Amer: >60 mL/min (ref >60)
Potassium: 3.7 mEq/L (ref 3.5–5.1)
Sodium: 138 mEq/L (ref 135–145)

## 2011-02-21 LAB — URINALYSIS, ROUTINE W REFLEX MICROSCOPIC
Bilirubin Urine: NEGATIVE
Glucose, UA: NEGATIVE mg/dL
Hgb urine dipstick: NEGATIVE
Ketones, ur: NEGATIVE mg/dL
Nitrite: NEGATIVE
Protein, ur: NEGATIVE mg/dL
Specific Gravity, Urine: 1.025 (ref 1.005–1.030)
Urobilinogen, UA: 0.2 mg/dL (ref 0.0–1.0)
pH: 6 (ref 5.0–8.0)

## 2011-02-21 LAB — RAPID URINE DRUG SCREEN, HOSP PERFORMED
Amphetamines: NOT DETECTED
Barbiturates: NOT DETECTED
Benzodiazepines: POSITIVE — AB
Cocaine: POSITIVE — AB
Opiates: NOT DETECTED
Tetrahydrocannabinol: NOT DETECTED

## 2011-02-21 LAB — DIFFERENTIAL
Basophils Relative: 0 % (ref 0–1)
Eosinophils Relative: 1 % (ref 0–5)
Lymphocytes Relative: 25 % (ref 12–46)
Monocytes Absolute: 0.9 10*3/uL (ref 0.1–1.0)
Monocytes Relative: 9 % (ref 3–12)
Neutro Abs: 7 10*3/uL (ref 1.7–7.7)

## 2011-02-21 LAB — URINE MICROSCOPIC-ADD ON

## 2011-02-21 LAB — D-DIMER, QUANTITATIVE: D-Dimer, Quant: 0.25 ug{FEU}/mL (ref 0.00–0.48)

## 2011-02-21 LAB — URINE CULTURE
Colony Count: NO GROWTH
Culture: NO GROWTH

## 2011-02-22 LAB — POCT CARDIAC MARKERS
CKMB, poc: 1.1 ng/mL (ref 1.0–8.0)
Troponin i, poc: <0.05 ng/mL (ref 0.00–0.09)

## 2011-02-22 LAB — COMPREHENSIVE METABOLIC PANEL
ALT: 14 U/L (ref 0–35)
AST: 20 U/L (ref 0–37)
Albumin: 4.4 g/dL (ref 3.5–5.2)
Alkaline Phosphatase: 85 U/L (ref 39–117)
CO2: 30 mEq/L (ref 19–32)
Chloride: 103 mEq/L (ref 96–112)
Creatinine, Ser: 1 mg/dL (ref 0.4–1.2)
GFR calc Af Amer: >60 mL/min (ref >60)
GFR calc non Af Amer: 58 mL/min — ABNORMAL LOW (ref >60)
Potassium: 4.6 mEq/L (ref 3.5–5.1)
Total Bilirubin: 0.5 mg/dL (ref 0.3–1.2)

## 2011-02-22 LAB — RAPID URINE DRUG SCREEN, HOSP PERFORMED
Benzodiazepines: POSITIVE — AB
Tetrahydrocannabinol: NOT DETECTED

## 2011-02-22 LAB — URINALYSIS, ROUTINE W REFLEX MICROSCOPIC
Bilirubin Urine: NEGATIVE
Protein, ur: NEGATIVE mg/dL
Urobilinogen, UA: 0.2 mg/dL (ref 0.0–1.0)

## 2011-02-22 LAB — DIFFERENTIAL
Basophils Absolute: 0 10*3/uL (ref 0.0–0.1)
Basophils Relative: 0 % (ref 0–1)
Eosinophils Absolute: 0.1 10*3/uL (ref 0.0–0.7)
Eosinophils Relative: 1 % (ref 0–5)
Monocytes Absolute: 0.7 10*3/uL (ref 0.1–1.0)

## 2011-02-22 LAB — URINE CULTURE

## 2011-02-22 LAB — URINE MICROSCOPIC-ADD ON

## 2011-02-22 LAB — CBC
MCV: 90.2 fL (ref 78.0–100.0)
RBC: 4.84 MIL/uL (ref 3.87–5.11)
WBC: 11 10*3/uL — ABNORMAL HIGH (ref 4.0–10.5)

## 2011-02-24 LAB — URINALYSIS, ROUTINE W REFLEX MICROSCOPIC
Bilirubin Urine: NEGATIVE
Nitrite: NEGATIVE
Specific Gravity, Urine: <1.005 — ABNORMAL LOW (ref 1.005–1.030)
Urobilinogen, UA: 0.2 mg/dL (ref 0.0–1.0)
pH: 6.5 (ref 5.0–8.0)

## 2011-02-24 LAB — URINE MICROSCOPIC-ADD ON

## 2011-02-25 LAB — URINALYSIS, ROUTINE W REFLEX MICROSCOPIC
Glucose, UA: NEGATIVE mg/dL
Hgb urine dipstick: NEGATIVE
Ketones, ur: NEGATIVE mg/dL
Protein, ur: NEGATIVE mg/dL
pH: 5.5 (ref 5.0–8.0)

## 2011-02-25 LAB — URINE MICROSCOPIC-ADD ON
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
## 2011-04-04 NOTE — Assessment & Plan Note (Signed)
[name] is back regarding her back pain. She feels that in general her back  is doing a bit better, but she notes increased pain down the left leg  particularly when she walks or is active during the day. It seems to be  getting worse over the last 3-4 months time. She rates her pain on  average a 6/10. Today, it is a 4/10 and she describes it as stabbing and  aching. She uses Zanaflex for spasm and this seems to be effective for  her without drowsiness. She uses Lortabs for breakthrough pain once or  twice a day. She is involved in vocational rehab regarding job re-entry  and seems to be enthusiastic about that.   REVIEW OF SYSTEMS:  The patient reports numbness in her left leg into  the foot. There is also some pain and tingling. Has had some occasional  night sweats. Other pertinent positives as listed above and full review  is in the written Health and History section of the chart.   SOCIAL HISTORY:  The patient is smoking a half pack of cigarettes per  day. Job issues are noted above.   PHYSICAL EXAMINATION:  Blood pressure 108/67, pulse 89, respiratory rate  is 18. She is satting 99% on room air. The patient is pleasant, alert  and oriented x3. She has some tenderness in the left PSIS area as well  as the lumbar paraspinals in the left. She is a bit better with  flexion/extension today, but flexion causes more pain. She has a  positive seated slump test today. Straight leg testing was positive.  Reflexes were 2+ in uppers and all four locations in the lower  extremities. Motor function was 5/5.  HEART: Regular.  CHEST: Clear.  ABDOMEN: Soft and nontender.   ASSESSMENT:  1. Multifactorial cervical and lumbar spine pain. The patient has had      further signs and symptoms of radiculopathy on the left, perhaps at      L5, over the last 2-3 months time. She has had injections in the      past which she does not recall as being helpful.  2. Depression.   PLAN:  1. Continue Lortab  and Zanaflex for pain and spasm control.  2. Continue Pilates exercises and daily walking as tolerated.  3. Encouraged job re-entry.  4. Order MRI of lumbar spine to assess her disc disease and potential      radiculopathy. She may benefit from a selective injection.   I will see her back pending results of her MR.      Ranelle Oyster, M.D.  Electronically Signed     ZTS/MedQ  D:  12/24/2007 13:10:58  T:  12/24/2007 21:31:50  Job #:  [id number]   cc:   Alfredia Client, MD  Fax: [phone]

## 2011-04-04 NOTE — Assessment & Plan Note (Signed)
[facility]                            CARDIOLOGY OFFICE NOTE   NAME:Filo, BRILEE PORT                        MRN:          [id number]  DATE:05/01/2007                            DOB:          06/10/1956    PRIMARY CARE PHYSICIAN:  Molly Maduro Day, MD   REASON FOR PRESENTATION:  Evaluate patient with chest pain and shortness  of breath.   HISTORY OF PRESENT ILLNESS:  The patient presents for followup of  multiple problems. She called in last week and was having coughing,  chest discomfort. She thought it was related to the Diltiazem. She was  told to stop this. She says the cough she was having is much improved.  She still is getting episodes of chest discomfort. This is sporadic.  This is sharp. It does not occur with any particular activity. There is  no radiation. There are no associated symptoms. She has multiple other  complaints such as tingling in her left hand, she says her feet turn  blue. She has these sweating spells. These have been getting worse. She  denies any resting shortness of breath. Does not have any PND or  orthopnea. She is not having any chest pressure, neck or arm discomfort.  She is continuing to have palpitations. She describes a rapid heart  beat. She comes with an extensive diary outlining minimal activities in  which her heart rate is sometimes in the 100 to 110 range. However, she  is not describing sudden acute fluctuations of this at rest or irregular  rhythms consistent with atrial fibrillation.   PAST MEDICAL HISTORY:  1. Hospitalized for suicidal thoughts in the past.  2. Major depressive disorder.  3. Questionable schizophrenia versus bipolar.  4. Chronic obstructive pulmonary disease.  5. Depression.  6. Gastroesophageal reflux disease.  7. 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  8. Scoliosis.  9. Diverticulosis.  10.Hyperlipidemia.  11.Cholecystectomy.   ALLERGIES:  PENICILLIN.   CURRENT  MEDICATIONS:  1. Accolate 20 mg b.i.d.  2. Simvastatin 40 mg daily.  3. Mirtazapine 15 mg q nightly.  4. Lortab.  5. Dolethylene.  6. Nitrofurantoin.  7. Advair.   REVIEW OF SYSTEMS:  As stated in the HPI and otherwise negative for  other systems.   PHYSICAL EXAMINATION:  The patient is in no acute distress. She appears  anxious. Blood pressure 124/88, heart rate 102 and regular.  HEENT: Eyelids unremarkable. Pupils equal, round, and reactive to light.  Fundi not visualized. Oral mucosa is unremarkable.  NECK: No jugular venous distention at 45 degrees. Carotid upstroke brisk  and symmetrical. No bruits, no thyromegaly.  LYMPHATICS: No cervical, axillary or inguinal adenopathy.  LUNGS: Clear to auscultation bilaterally.  BACK: No costovertebral angle tenderness.  CHEST: Unremarkable.  HEART: PMI not displaced or sustained. S1, S2 within normal limits. No  S3. No S4. No clicks, rub or murmurs.  ABDOMEN: Flat, positive bowel sounds, normal in frequency and pitch. No  bruits. No rebound. No guarding. No midline pulsatile mass. No  hepatomegaly, splenomegaly.  SKIN: No rashes, no nodules.  EXTREMITIES: 2+ pulses  throughout. No cyanosis or clubbing.  NEURO: Oriented to person, place and time. Cranial nerves II-XII grossly  intact. Motor grossly intact throughout.   EKG: Axis within normal limits, intervals within normal limits, no acute  ST-T wave changes. Poor anterior R-wave progression.   ASSESSMENT/PLAN:  1. Palpitations. The patient wore an event monitor etiology of sinus      tachycardia. She did not tolerate Diltiazem. I am going to give her      verapamil 40 mg t.i.d. and see if this helps. She has had blood      work. I would like to get a 24 hour urine in the future to make      sure she does not have evidence of pheochromocytoma, though I think      the pre-test probability of this is very low. She has had an      echocardiogram which demonstrated no abnormalities.   2. Chest discomfort. The patient has chest discomfort and multiple      other symptoms. Because of this, I am going to do an exercise      perfusion study. Further evaluation based on these results.  3. Followup. I will see her back based on the results of the above and      the response to her verapamil.     Rollene Rotunda, MD, [facility]  Electronically Signed    JH/MedQ  DD: 05/01/2007  DT: 05/01/2007  Job #: [id number]   cc:   Alfredia Client, MD

## 2011-04-04 NOTE — Assessment & Plan Note (Signed)
[name]                       [name] OFFICE NOTE   NAME:[name]                        MRN:          [id number]  DATE:07/16/2007                            DOB:          28-May-1956    [name] is seen as a new patient to me.  She has previously been seen  by Dr. Antoine Poche.  She has been evaluated for palpitations, sinus  tachycardia, dyspnea, and atypical chest pain.  The patient has a  history of significant anxiety and depression and has had previous  suicide attempts. She seems very fixated on her vital signs.  She had  approximately 14 pages of paper documenting what her blood pressure and  pulse were and exactly what she was doing.  These were almost on an  hourly level.  It was a bit obsessive-compulsive.  I talked to the  patient at length.  She continues to have mild exertional dyspnea, seems  functional.  There has been a recent URI.  She was placed on antibiotics  by Dr. Daphine Deutscher.  She has had a slight cough but no sputum production.  She said her temperature was 100.5 over the weekend but has not been  elevated the last 48 hours.  She continues to smoke, although she says  she cut back.  She admits to 4-5 cigarettes per day.   The dyspnea is slightly worse since she has had the URI.   She has had atypical chest pain in the past. The pain has been  intermittent, nonexertional, and sharp.  It occasionally radiates  towards the shoulders.  Deep breath can sometimes relieve the pain.  She  is not taking nitroglycerin for it.  She was supposed to have a stress  Myoview this past month but rescheduled it due to a question of  diverticulitis.  This needs to be rescheduled.   She did have a 2-D echocardiogram which showed good LV function with no  valvular heart disease.   In regard to her palpitations, she appears to have always run a bit of a  sinus tachycardia.  She has recent TSH that was 1.5.  She denies drug  use  outside of the nicotine.  Dr. Jenene Slicker note indicated question  checking urine for metanephrines, but I do not believe this was ever  done.   REVIEW OF SYSTEMS:  Remarkable for recent bout of questionable  diverticulitis.  She has a known history of diverticulosis.  She tells  me she had some kind of scan which sounded like an abdominal CT scan and  was on antibiotics about 4 weeks ago.  Again, I do not have the x-ray  results here.  Her Review of Systems is otherwise negative.   MEDICATIONS:  1. Accolate.  2. Simvastatin 40 a day.  3. Lortab.  4. Nitrofurantoin.  5. Sertraline 50 mg a day.  6. Aspirin 1 a day.   Multiple records were reviewed from the patient.  Time: Over 10 minutes.   Her baseline EKG today showed sinus tachycardia at a rate of 105 with  insignificant Q waves in II, III,  and F.  Poor R wave progression.   PHYSICAL EXAMINATION:  GENERAL:  Exam is remarkable for a chronically  ill-appearing, middle-age white female with some anxiety.  Affect is a  bit unorthodox.  She has tattoos on her arm.  VITAL SIGNS: Her pulse during examination was 95.  Weight 148.  Blood  pressure 130/80, respiratory rate 12.  She is afebrile.  HEENT:  Normal.  NECK:  Carotids are without bruit.  There is no JVP elevation seen.  No  lymphadenopathy, no thyromegaly.  LUNGS:  Clear with good diaphragmatic motion.  No wheezing.  CARDIAC:  S1, S2 with normal heart sounds.  PMI is normal.  ABDOMEN:  Protuberant.  Bowel sounds are positive.  There is no  tenderness, no hepatosplenomegaly, no hepatojugular reflux, no bruit, no  AAA.  EXTREMITIES:  Femorals are +3 bilaterally without bruit.  PTs are +3.  There is no lower extremity edema.  NEUROLOGIC:  Nonfocal.  There was no muscular weakness.  SKIN:  Warm and dry.   IMPRESSION:  1. Atypical chest pain with coronary risk factors. Reschedule Myoview      for 3-4 weeks once her upper respiratory infection (URI) is over.  2. Exertional  dyspnea in the setting of a URI.  She may need a Z-Pak,      but I will leave this up to [name] and      Dr. Daphine Deutscher.  She is currently afebrile, and her lungs sound clear      without wheezing.  She will continue to use her inhalers.  3. Clinical diagnosis of chronic obstructive pulmonary disease, likely      makes her prone to URIs.  She does have inhalers, so she has not      needed prednisone.  With her pulses at this time, she should      probably have baseline PFTs.  She has been trying to cut back on      her own and would not appear to be a candidate for Chantix given      her previous suicidal ideation and anxiety and depression.  4. Relative resting tachycardia.  I suspect this is a combination of      the nicotine in her system and anxiety.  There has not been any      evidence of pathological tachycardia.  Her home monitoring reports      are extensive and show no evidence of significant paroxysmal atrial      fibrillation.  She may be at risk for multifocal atrial      tachycardia; however, it appears that Dr. Antoine Poche has tried      Cardizem in the past, and she did not tolerate calcium blockers.  5. Hypercholesterolemia.  Continue Simvastatin 40 a day.  Follow up      lipid and liver profile in 6 months.   I will see the patient back when she has her stress test in 3-4 weeks.    Amber Pick. Eden Emms, MD, [facility]  Electronically Signed   PCN/MedQ  DD: 07/16/2007  DT: 07/17/2007  Job #: [id number]   cc:   Dr. Daphine Deutscher, [facility]

## 2011-04-04 NOTE — Cardiovascular Report (Signed)
[name], MOL NO.:  [id number]   MEDICAL RECORD NO.:  [id number]          PATIENT TYPE:  INP   LOCATION:  2867                         FACILITY:  [facility]   PHYSICIAN:  Veverly Fells. Excell Seltzer, MD  DATE OF BIRTH:  Apr 30, 1956   DATE OF PROCEDURE:  03/24/2008  DATE OF DISCHARGE:                            CARDIAC CATHETERIZATION   PROCEDURES:  1. Left heart catheterization.  2. Selective coronary angiography.  3. Left ventricular angiography.   INDICATIONS:  [name] is a 55 year old woman who presented with chest  pain.  She has multiple cardiac risk factors including hyperlipidemia,  hypertension, ongoing tobacco abuse and family history of CAD.  She was  referred for cardiac catheterization for a definitive evaluation.   Risks and indications of the procedure were reviewed with the patient.  Informed consent was obtained.  The right groin was prepped, draped, and  anesthetized with 1% lidocaine.  Using the modified Seldinger technique,  a 5-French sheath was placed in the right femoral artery.  Standard 5-  French Judkins catheters were used for coronary angiography and left  ventriculography.  All catheter exchanges were performed over a  guidewire.  There were no immediate complications.  The patient  tolerated the procedure well.   FINDINGS:  Aortic pressure 104/64 with a mean of 83, left ventricular  pressure 108/11.   Coronary angiography, left mainstem:  The left mainstem is widely  patent.  There is no significant stenosis.  It trifurcates into the LAD,  intermediate branch and left circumflex.   LAD:  The LAD is a moderate size vessel that is smooth throughout its  course.  There are no significant irregularities or stenoses.  The LAD  reaches the LV apex.  It supplies a moderate-sized first septal  perforator and a large first diagonal branch.  There are no significant  stenoses throughout the LAD or its branch vessels.   Left circumflex:   There is a large intermediate branch with no  significant stenoses.  The left circumflex courses down and supplies a  large first OM branch and a moderate-sized left posterolateral branch.  There are no significant stenoses throughout the left circumflex.   Right coronary artery:  The right coronary artery is a moderate-sized  vessel.  It is smooth throughout its course without any significant  stenoses.  Distally, it supplies a PDA branch and a posterolateral  branch.  There is a moderate-sized RV marginal branch that bifurcates  into twin vessels from the mid vessel.   There are no stenoses throughout the right coronary artery or its branch  vessels.   Left ventricular function is normal with third-degree RAO left  ventriculography.  The LVEF is 60-65.  There is no significant mitral  regurgitation.   ASSESSMENT:  1. Normal coronary arteries.  2. Normal left ventricular function.   PLAN:  [name] likely has had noncardiac chest pain.  She will be  eligible for discharge home later today and can follow up with her  primary physician.      Veverly Fells. Excell Seltzer, MD  Electronically Signed  MDC/MEDQ  D:  03/24/2008  T:  03/24/2008  Job:  [id number]

## 2011-04-04 NOTE — Assessment & Plan Note (Signed)
[name] is back regarding her back pain.  She feels that the pain has  increased over the last few months.  She did no-show for her last visit  on July 22, 2008.  Pain is mostly in the back on the right and left  sides now.  It does not really radiate into the legs, although she has  complained of some calf pain over the last few days, which has been  intermittent.  She rates the pain as 7-8/10.  She stopped the Effexor 2  months ago as she did not notice any effects, although she only  increased to a 75 mg dose.  She uses Lortab for breakthrough pain.  She  does not feel Tresa Garter is helping.  She rates her pain 7-8/10, describes it  as aching and burning.  Pain interferes with general activity, in  relationship with others, and enjoyment of life on a moderate level.  She can walk 10-15 minutes without having to stop.   REVIEW OF SYSTEMS:  Notable for tingling, depression, anxiety, and night  sweats.  Other pertinent positives are as above.  Full review is in the  written health and history section of the chart.   SOCIAL HISTORY:  The patient is married and smoking half pack of  cigarettes per day still.   PHYSICAL EXAMINATION:  VITAL SIGNS:  Blood pressure is 99/62, pulse is  88, respiratory rate 18, and she is sating 100% on room air.  GENERAL:  The patient is pleasant, alert, an oriented x3.  She has some  pain with sitting and moving from a sit-to-stand position.  Strength is  5/5 in both legs with equivocal straight leg testing bilaterally.  She  does have pain with extension and facet maneuvers bilaterally.  She had  less pain I felt with flexion today.  Pain was notable with palpation  over the L3-L4 and L4-L5 facets, right more than left today.  PSIS areas  were less tender in general I felt.  HEART:  Regular.  CHEST:  Clear.  ABDOMEN:  Soft and nontender.   ASSESSMENT:  1. Multifactorial cervical lumbar spine pain.  Lumbar pain seems to be      more related to facet  arthropathy again today.  2. History of diverticulitis.  3. Depression.   PLAN:  1. I told the patient I was not going to blindly treat her with long-      acting narcotic medications for her pain when there are options      available to her such as facet blocks or medial branch blocks.  She      agreed to proceed with medial branch blocks, which we will try at      L3-L4 and L4-L5 bilaterally.  She seemed interested in RF if      appropriate as well.  2. Continue Lortab for breakthrough pain 10/500 mg 1 q.6 h. p.r.n.  3. We will resume Effexor XR 75 mg 1 q.a.m. for 1 week, then 2 q.a.m.      thereafter.  4. Change Soma to Flexeril 10 mg 1 q.8 h. p.r.n., #60.  5. I will see her back pending injections as above.      Ranelle Oyster, M.D.  Electronically Signed     ZTS/MedQ  D:  08/24/2008 17:34:22  T:  08/25/2008 06:20:12  Job #:  [id number]   cc:   Alfredia Client, MD  Fax: [phone]

## 2011-04-04 NOTE — Consult Note (Signed)
[name], [name]                ACCOUNT NO.:  [id number]   MEDICAL RECORD NO.:  [id number]          PATIENT TYPE:  INP   LOCATION:  A211                          FACILITY:  [facility]   PHYSICIAN:  Gerrit Friends. Dietrich Pates, MD, FACCDATE OF BIRTH:  [birth year]   DATE OF CONSULTATION:  09/26/2007  DATE OF DISCHARGE:                                 CONSULTATION   ALTERNATE MEDICAL RECORD NUMBER:  [id number].   Note:  Her alternate name is [name].   CARDIOLOGIST:  Dr. Charlton Haws.  She had previously seen Dr. Rollene Rotunda.   PRIMARY CARE PHYSICIAN:  Dr. Paulene Floor PA-C, [facility].   REASON FOR CONSULTATION:  Chest pain.   HISTORY OF PRESENT ILLNESS:  [name] is a 55 year old female patient  with no previously documented coronary disease, who presented to [facility] yesterday with complaints of chest pain.   The patient had previously seen Dr. Antoine Poche, on two occasions.  Her  first encounter was in March of 2008.  She was referred for  palpitations.  She had had an event monitor placed that revealed narrow  complex tachycardia.  Her rates have been as high as 145, but only  during short runs.  The impression was SVT or sinus tach, and she was  started on low-dose diltiazem.  She was intolerant to this, and then was  switched to low-dose Verapamil.  She was also intolerant to this.  She  then switched to Dr. Eden Emms, who she saw in August 2008.  Prior to that  visit, she had complained of chest pain, had been set up for a stress  test.  This had never been performed.  She had several bouts of upper  respiratory tract infection-type symptoms.  When she saw Dr. Eden Emms, he  suggested rescheduling a stress Myoview study.  Of note, she did have an  echocardiogram done in April 2008 that revealed good LV function and no  significant valvular abnormalities.  She has had chest pain off and on  for the last several months.   Last night, she  presented to the hospital after developing chest  discomfort, shortly after eating.  This was a left-sided chest squeezing  sensation with radiation across her chest.  She did note nausea and  diaphoresis. .  There was no radiation.  She did note near-syncope.  She  also noted rib soreness.  She also noted soreness to the touch.  She  also noted pleuritic symptoms.  She was apparently given aspirin and  morphine.  The morphine made her chest pain better.  As noted, she has  had chest pain for the last several months.  She has a different type of  chest pain every time.  She rarely has denied any exertional chest  discomfort.  She is somewhat limited by low back pain.  She describes  NYHA class II-B to III symptoms.  She describes sleeping on 3 pillows.  She does awaken at times short of breath, but it is uncertain whether or  not she has clear-cut PND.  She also has minimal pedal edema that is  chronic without change.  She denies any episodes of syncope.  She notes  weakness associated with her tachy palpitations.   PAST MEDICAL HISTORY:  1. Hyperlipidemia.      a.     She has been on simvastatin the last several months.  2. COPD.  3. Benign right renal tumor.  4. Status post partial colectomy, secondary to diverticulitis.  5. Degenerative disk disease.      a.     Chronic low back pain.  6. GERD.  7. Depression.      a.     Status post admission secondary to his suicidal ideation.      b.     Question schizophrenia versus bipolar disorder.  8. Scoliosis.  9. Status post appendectomy.  10.Status post tonsillectomy.  11.Status post ovarian cystectomy.  12.History of traumatic pneumothorax.  13.History of rectal fissurectomy.   HOME MEDICATIONS:  1. Accolate.  2. Albuterol MDI.  3. Allegra.  4. Nitrofurantoin.  5. Sertraline.  6. Simvastatin.  7. Soma.  8. Lorcet.   CURRENT MEDICATIONS:  1. Aspirin 325 mg daily.  2. Lovenox 40 mg daily.  3. Protonix 40 mg daily.  4.  Nitroglycerin p.r.n. chest pain.  5. Simvastatin 40 mg q.h.s.   ALLERGIES:  PENICILLIN, DOXYCYCLINE.   SOCIAL HISTORY:  The patient lives in [place].  She is married.  She  has three children.  She is unemployed.  She has 17 pack-year history of  smoking with half a pack of cigarettes per day for 35 years.  She denies  alcohol or drug abuse.   FAMILY HISTORY:  Significant for coronary artery disease.  Mother is  alive and had a myocardial infarction, age 56.  Her father is deceased,  secondary to complications from COPD and Coal [employer]er's lung.  He did have  a myocardial infarction, age 57.  She also has a brother who had  myocardial infarction, age 73.   REVIEW OF SYSTEMS:  She has noted fevers with temperature up to 99.5, as  well as chills.  No headaches.  She has noted a cough productive of  green-yellow sputum.  She has also noted hemoptysis.  She has been  treated with several rounds of antibiotics over the last several months,  secondary to URI symptoms.  She denies dysuria, hematuria.  Denies any  bright red blood per rectum or melena.  Denies any dysphagia,  odynophagia.  She denies any dyspepsia.  She denies any monocular  blindness, unilateral weakness, difficulty of speech or facial droop.  She does have some left lower extremity pain with ambulation.  Rest of  the review of systems are negative.   PHYSICAL EXAM:  GENERAL:  She is a well-nourished, well-developed female  in no distress.  VITAL SIGNS:  Blood pressure is 113/65, pulse 85, respirations 18,  temperature  97.8, oxygen saturation 96% on room air.  Weight 63.4 kg.  HEENT:  Normal.  NECK:  Without JVD.  LYMPH:  Without lymphadenopathy.  ENDOCRINE:  Without thyromegaly.  CARDIAC:  Normal S1, S2.  Regular rate and rhythm.  No S3, no murmurs.  LUNGS:  With decreased breath sounds bilaterally.  No wheezing, no  rhonchi, no rales noted.  Soft, nontender with bowel sounds,  no  organomegaly.  EXTREMITIES:   Without clubbing, cyanosis or edema.  SKIN:  Warm and dry.  MUSCULOSKELETAL:  She has chest wall tenderness with palpation over the  left chest,  as well as the central chest.  NEUROLOGIC:  She is alert and oriented x3.  Cranial nerves II-XII are  grossly intact.   Chest x-ray showed no acute disease, chronic mild peribronchial  thickening, without focal air space opacity.  EKG reveals sinus rhythm,  heart rate of 99, questionable significant Q-waves in II, III and aVF.  No ischemic changes.   LABORATORY DATA:  Hemoglobin 12.8, hematocrit 37.5, white count 11,900,  platelet count 314,000, sodium 143, potassium 3.9, BUN 4, creatinine  0.67, glucose 85, point of care markers negative x2.  CK 52, CK-MB 0.9,  troponin-I 0.02.  Follow-up cardiac markers pending, D-dimer 0.22, INR  0.9.   IMPRESSION:  1. Atypical chest pain.  2. Hyperlipidemia.  3. Chronic obstructive pulmonary disease with recent exacerbation.      a.     Recent history of hemoptysis.  4. Tobacco abuse.  5. Family history of coronary disease.  6. History of tachyarrhythmia.      a.     Sinus tach verses supraventricular tachycardia versus       multifocal atrial tachycardia.  7. Good left ventricular function.  8. Depression with history of admission for suicidal ideation.  9. Chronic low back pain.   PLAN:  The patient presents with chest pain.  Her symptoms are very  typical for ischemia, but she does have cardiac risk factors.  A  myocardial infarction thus far, has been ruled out.  Will proceed with  inpatient Myoview testing.  She does have complaints of hemoptysis.  Further workup of this will be  per admitting service.  I suspect some of her pain is chest wall in  nature, secondary to her cough.  We will add NSAIDS with Motrin 400 mg  three times a day.  She will need to remain on Protonix with this.  Further recommendations to follow.      Tereso Newcomer, PA-C      Gerrit Friends. Dietrich Pates, MD, [facility]   Electronically Signed    SW/MEDQ  D:  09/26/2007  T:  09/26/2007  Job:  [id number]   cc:   Marcello Moores, MD

## 2011-04-04 NOTE — Assessment & Plan Note (Signed)
[name] is back regarding chronic low back pain.  [name] has not been back  here since March.  She failed to follow-up.  She is back here today as  she has run out of her Lortab and has had increased low back pain.  She  saw Dr. Annabell Howells, Urology, apparently earlier today who stated she did not  have any acute urinary issues nor UTI.  He recommended that she discuss  her back and hip pain with me today.  She states that the right back and  hip worsened after a long walk she had a couple days ago and has not  improved since then.  She rates her pain as 7 out of 10, describes as  stabbing and aching.  She does not have pain past the right hip.  Pain  is worse with activity as noted above.  She also gets stiff when she  sits for a while.  She states the Lortab is not helping much anymore.  The patient has had a lumbar scan done actually about a year ago from  today which revealed lumbar spondylosis and degenerative disk disease  without obvious nerve root involvement.  We discussed potential  interventions and medications.  She was not interested in really  anything.  We attempted therapy and she had some tachycardia, still has  not been cleared completely by cardiology.  She has stress test  scheduled soon.   REVIEW OF SYSTEMS:  The patient reports depression, spasms, tremor,  night sweats.  Other pertinent positives listed above, her full review  is in the written health and history section.   SOCIAL HISTORY:  The patient is married and living with her husband.  She still smokes regularly.   PHYSICAL EXAM:  Blood pressure 115/74, pulse is 8, respiratory rate 18,  she is sating 99% in room air.  Patient is very tender in the lumbar  spine with pain with palpation over the lumbar paraspinals and facets on  the right side over the left.  The L4-S1 areas appear to be the most  tender.  She is limited in all planes of movement.  She has an antalgic  gait favoring the right leg.  She has a hard time  keeping a neutral  posture in stance.  The legs are generally intact for motor and sensory  function, although there is some pain inhibition on the right side.  She  has some pain at the piriformis areas well as the greater trochanter  region on the right.  Reflexes are 2+.  HEART:  Regular.  CHEST:  Clear.  ABDOMEN:  Soft, nontender.   ASSESSMENT:  1. Multifactorial cervical lumbar spine pain.  The patient has facet      and discogenic signs in the back, although she is very tender      today.  There is likely a muscle spasm component after her recent      exacerbation.  The patient remains resistant to most measures.  We      have discussed injections therapy, medications in the past.      Frankly I am not quite sure ever what she really wants as we tend      to talk in a circular fashion every time we meet.  2. Depression.  3. Tachycardia.   PLAN:  1. I gave the patient a refill on her Lortab  2. Will try Soma 350 mg one q. 8 hour p.r.n. for spasm.  3. We will set her  up with a lumbosacral orthosis for back support and      postural control.  4. I want her to resume her home stretching and Pilates exercises we      have discussed.  5. I will see her back in a few months.  She tells me that she is      limited on her visits until next June due to [facility] and she will      not be able to come back here very often.  She sees cardiology and      neurology as well.  I      will leave follow-up frequency to her at this point.  Again this      the continues to be a difficult case as patient is resistant to      most recommendations and is a bit inconsistent with her carry      through and reasoning.      Ranelle Oyster, M.D.  Electronically Signed     ZTS/MedQ  D:  08/23/2007 13:05:23  T:  08/24/2007 09:50:46  Job #:  [id number]   cc:   Alfredia Client, MD  Fax: [phone]
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
## 2011-04-04 NOTE — Assessment & Plan Note (Signed)
[name] is back regarding her back pain.  She has had some recent problems  with her stomach once again.  There is a history of diverticulitis and  she is going to see a specialist today regarding a followup abdominal  CT.  She has had prior colon resection in the remote past.  Due to the  patient's left leg symptoms that she was experiencing along with her  back pain, we sent her for another MRI last month, which was remarkable  for multilevel facet degenerative changes and a tiny left paracentral  protrusion at L5-S1.  The central canal and foramina were widely patent  at all levels.  I felt perhaps that she could benefit from an L5-S1 left-  sided transforaminal injection for left leg symptoms, but we did not go  through with that.  The patient uses Lortab still for breakthrough pain.  The Zanaflex was not helpful for her pain.  She has tried Flexeril,  which has not helped either.   She rates her pain a 7/10, describes it as aching and stabbing,  sometimes tingling into the left leg.  Pain appears to be worse when she  stands or walks.  Pain interferes with general activity, relations with  others, enjoyment of life on a moderate level.   REVIEW OF SYSTEMS:  Notable for the above as well as some dizziness,  weakness, bowel and bladder issues as well, occasional nausea,  constipation, and limb swelling.   SOCIAL HISTORY:  The patient is married and lives with her husband.  She  smokes 1/2 pack of cigarettes per day still.   PHYSICAL EXAMINATION:  Blood pressure is 103/68, pulse 90, respiratory  rate 18.  She is satting 97% on room air.  Patient is very alert and  appropriate.  We motor tested her legs and essentially she was 5/5 on both sides with  some pain in admission proximally.  Sensory exam is intact.  Her  reflexes are 2+.  Straight leg testing was equivocal to negative on both  sides.  She had some pain in the lumbar spine with flexion, although  most significant pain was  with extension at facet maneuvers left greater  than right.  HEART:  Regular.  CHEST:  Clear.  ABDOMEN:  Slightly tender to touch in the lower quadrants and in the  sides.   ASSESSMENT:  1. Multifactorial cervical lumbar spine pain.  Her lumbar pain seems      to be most consistent with facet arthropathy, although this would      not account for her leg symptoms.  She may be having some      intermittent radiculitis, but MRI was not remarkable for any signs      of this.  2. History of diverticulitis.  3. Depression.   PLAN:  1. Continue Lortab.  I did give her an extra 30 tablets per month for      pain control.  I stated I would not simply treat her with narcotic      medications.  We need to try to address her specific problems.  I      do understand that she has some GI issues currently pending, so we      did not push interventional treatment at this time.  I would      recommend down the line a left-sided lower lumbar medial branch      block series, perhaps at L4-L5 and L5-S1.  Patient seemed to be  okay with this in the future pending her GI course.  2. We will try Skelaxin for muscle spasm 800 mg q. 6 hours p.r.n.  3. Continue exercise, cor muscle strengthening and stretching.  4. I will see her back in two months' time.      Ranelle Oyster, M.D.  Electronically Signed     ZTS/MedQ  D:  01/27/2008 10:31:33  T:  01/27/2008 14:45:22  Job #:  [id number]   cc:   Alfredia Client, MD  Fax: [phone]

## 2011-04-04 NOTE — Discharge Summary (Signed)
[name], [name]                ACCOUNT NO.:  [id number]   MEDICAL RECORD NO.:  [id number]          PATIENT TYPE:  OBV   LOCATION:  A211                          FACILITY:  [facility]   PHYSICIAN:  Osvaldo Shipper, MD     DATE OF BIRTH:  01-Sep-1956   DATE OF ADMISSION:  09/26/2007  DATE OF DISCHARGE:  11/07/2008LH                               DISCHARGE SUMMARY   DISCHARGE DIAGNOSES:  1. Chest pain, noncardiac etiology, likely gastroesophageal reflux      disease.  2. Anxiety disorder.  3. Chronic urinary tract infections.  4. Chronic back pain.  5. Acute bronchitis.   Please review H&P dictated yesterday for details regarding the patient's  presenting illness.   BRIEF HOSPITAL COURSE:  This is a 55 year old Caucasian female who  presented with complaints of chest pain located in her retrosternal area  radiating to the neck, to the back, to the belly.  The patient was  evaluated.  She underwent CT scan which was negative for dissection or  PE.  Chest x-ray did not show any acute cardiopulmonary process.  She  was observed in the hospital and ruled out for acute coronary syndrome.  EKG was nonspecific.  Cardiology saw her, and she underwent a stress  test today.  I was informed by the nurse that cardiology cleared her.  Hence, I am assuming that the stress test was probably low-risk or  negative.   The patient today again continued to complain of chest pain.  We checked  a lipase which was negative.  Her LFTs were normal.  She was complaining  of heartburn.  Hence, we prescribed her some Prilosec to take home.  She  was also having some cough with yellowish expectoration.  Chest x-ray  was negative.  Her white count was slightly elevated.  Hence, I put her  on antibiotics and steroids since she is a smoker, and this could be  acute bronchitis.   Her back pain could be contributing to her chest pain as well.  I have  encouraged her to follow up with her PMD and maybe get a referral  to a  neurosurgeon.   DISCHARGE MEDICATIONS:  1. Prednisone 40 mg for 3 days and then 20 mg for 3 days and then 10      mg for 3 days.  2. Levaquin 750 mg p.o. daily for 3 more days.  3. Prilosec 20 mg once daily.  4. Aspirin 81 mg once daily.  5. Accolate 20 mg b.i.d.  6. Albuterol inhaler and nebulizer as needed.  7. Allegra 180 mg daily.  8. Nitrofurantoin 50 mg daily.  9. Sertraline 100 mg daily.  10.Simvastatin 40 mg daily.  11.Soma 250 mg b.i.d. p.r.n.  12.Lorcet 5/500 every 4 hours as needed for pain.  13.Vesicare 5 mg p.o. daily.   FOLLOW UP:  Follow up with PMD in 1 week.   DIET:  Heart healthy diet.   ACTIVITY:  No restrictions.   CONSULTATIONS:  [facility].   IMAGING STUDIES:  Chest x-ray, CT angiography, chest and abdomen, all of  which discussed  above.  There were some calcified mediastinal lymph  nodes also seen on the CT, kyphotic curvature at T6-7 was also noted.  Otherwise, no acute abnormality in the abdomen was seen.  Vertebral  canal narrowing was noted at the area of T6-T7, I think, and they  recommended MRI if clinically needed.  Since she does not have any  neurological deficits, we did not proceed with this, and her PMD may  want to consider getting this done at some point.   TIME SPENT AT DISCHARGE:  35 minutes.      Osvaldo Shipper, MD  Electronically Signed     GK/MEDQ  D:  09/27/2007  T:  09/27/2007  Job:  [id number]

## 2011-04-04 NOTE — H&P (Signed)
[name], [name]                 ACCOUNT NO.:  [id number]   MEDICAL RECORD NO.:  [id number]          PATIENT TYPE:  INP   LOCATION:  4705                         FACILITY:  [facility]   PHYSICIAN:  [name]. Eden Emms, MD, FACCDATE OF BIRTH:  [birth year]   DATE OF ADMISSION:  03/20/2008  DATE OF DISCHARGE:                              HISTORY & PHYSICAL   PRIMARY CARDIOLOGIST:  Dr. Theron Arista C. Eden Emms, MD, [facility]   PRIMARY CARE [name]:  Dr. Molly Maduro Day, MD   PAIN CLINIC [name]:  Ranelle Oyster, MD   PATIENT PROFILE:  A 55 year old married Caucasian female with a long  history of chest pain who presents with worsening problems.  1. Chest pain.      a.     Reported negative Myoview in November 2008.  2. COPD.  3. Ongoing tobacco abuse, currently less than half pack a Pearson.  4. Hypertension.  5. History of palpitations.      a.     02/21/2007, 2D echocardiogram EF 60-65%, mild TR trivial MR.  6. Hyperlipidemia.  7. Chronic low back pain.  8. Anxiety.  9. Depression with history of suicidal ideation.  10.GERD.  11.Scoliosis.  12.Diverticulosis.  13.Status post cholecystectomy.   HISTORY OF PRESENT ILLNESS:  A 55 year old married Caucasian female with  6-7 month history of 6-7/10 rest and exertional chest pain occurring  approximately 3-5 times a week under the left breast without radiation,  occasionally associated with shortness of breath, diaphoresis, lasting  approximately 45 minutes, and resolving spontaneously.  She reports  having had a negative Myoview at [facility] in November 2008,  although these records are not available in E chart.  She saw Dr. Eden Emms  on April 23rd and was arranged for cardiac catheterization on April  24th; however, she rescheduled this.  Today, she had 2 episodes of chest  pain worse than usual rating them 10/10, lasting about 10 minutes  resolving spontaneously.  Because of this she called  911 and presented  to [facility]  She  reports that she also had some headache and  lightheadedness in the setting of chest pain.  At [facility], her ECG  showed no acute changes and cardiac markers were negative x2.  She was  treated with IV nitroglycerin and at some point, her pain went away.  She is transferred to [facility] for further evaluation and cardiac  catheterizations, currently pain free.   ALLERGIES:  PENICILLIN.   HOME MEDICATIONS:  1. Accolate 20 mg b.i.d.  2. Simvastatin 40 mg daily.  3. Lortab b.i.d.  4. Aspirin 81 mg daily.  5. Ambien p.r.n.  6. MiraLax 17 g daily.  7. Allegra 180 mg daily.  8. Albuterol p.r.n.   FAMILY HISTORY:  Mother is alive at age 58 with a history of MI and CVA  x2 as well as diabetes.  Father died at 43 of black lung.  She has 4  brothers and 2 sisters.  Her oldest brother had an MI while the rest of  her siblings have a history of hypertension, hyperlipidemia, and  diabetes.   SOCIAL HISTORY:  She lives in [place] with her husband.  She does not  currently work.  She has a 17-pack-year history of tobacco abuse,  smoking about a half pack a Pearson for the past 34 years.  She currently  smokes less than half a pack a Pearson.  She denies any alcohol or drugs.  She is walking daily.   REVIEW OF SYSTEMS:  Positive for chest pain as well as depression,  anxiety, and mood disorder.  She currently has a headache on  nitroglycerin and had some lightheadedness earlier as well.  Otherwise  all system review is negative.   PHYSICAL EXAMINATION:  VITAL SIGNS:  Temperature is 98.0, heart rate 85,  respirations 20, blood pressure 114/74, and pulse ox 99% on room air.  GENERAL:  A pleasant white female in no acute distress.  Awake, alert  and oriented x3.  HEENT:  Normal.  NEUROLOGIC:  Intact.  Nonfocal.  SKIN:  Warm and dry without lesions or masses.  NECK:  No bruits or JVD.  LUNGS:  Respirations are unlabored.  Clear to auscultation.  CARDIAC:  Regular S1 and S2.  No S3 and S4, murmurs.   ABDOMEN:  Round, soft, nontender, and nondistended.  Bowel sounds  present x4.  EXTREMITIES:  Warm, dry, and pink.  No clubbing, cyanosis, or edema.  Dorsalis pedis, posterior tibial pulses 2+ and equal bilaterally.  No  femoral bruits noted bilaterally.   Chest x-ray from today shows no acute abnormality.  EKG shows sinus  rhythm at a rate of 86, normal axis, no acute ST-T wave changes.  Hemoglobin 13.8, hematocrit 39.1, WBC 7.8, and platelets 278.  Sodium  140, potassium 3.9, chloride 103, CO2 of 30, BUN 9, creatinine 0.67,  glucose 90.  Cardiac markers negative x2, calcium 9.7   ASSESSMENT AND PLAN:  1. Chest pain.  She is predominately atypical with some typical      features.  2. Pain.  She is now pain free.  Plan to admit for cardiac markers.      Continue aspirin, statin, add a low-dose beta-blocker.  I plan on      cardiac catheterization on Monday as this was previously planned.  3. Hypertension, stable.  4. Hyperlipidemia.  Continue statin.  Check lipids and LFTs.  5. Tobacco abuse, cessation strongly advised.  6. Chronic obstructive pulmonary disease.  Continue albuterol and      Accolate.  Watch for wheezing in the setting of low-dose beta-      blocker.  7. Gastroesophageal reflux disease.  Add proton pump inhibitor.      [name], ANP      [name]. Eden Emms, MD, [facility]  Electronically Signed    CB/MEDQ  D:  03/20/2008  T:  03/21/2008  Job:  [id number]

## 2011-04-04 NOTE — Assessment & Plan Note (Signed)
[facility]                       [place] [facility] OFFICE NOTE   NAME:[name], [name]                        MRN:          [id number]  DATE:03/12/2008                            DOB:          11-10-1956    Ms. Corriher is seen today in followup.  She continues to have chest pain  that is occasionally exertional; however, it can occur at any time.  It  is fleeting, it is sharp. Unfortunately, there is an issue apparently  she was in the hospital here at [facility] in November.  She describes  having a chemical stress test.  We do not have any records of this, and  I personally do not remember the results.   However, the patient has persistent chest pain in the setting of ongoing  smoking.  She has exertional dyspnea which is likely related to COPD.  She continues to have palpitations.  She has flip-flops in her heart.  They are not necessarily exertional.  There is no associated syncope.   After lengthy discussion with the patient, I told her that the only way  to settle this would be to do a right and left heart cath.  I do not  think that she is having significant cardiac problems.  She also  complains of significant fatigue.  Her EF is 60-65% by echocardiogram  done February 21, 2007.  She had no significant valvular heart disease.   However, given her coronary risk factors, ongoing smoking, and  persistent chest pain, I think it is reasonable to proceed with a heart  cath.   ALLERGIES:  PENICILLIN.   CURRENT MEDICATIONS:  1. Accolate 20 b.i.d.  2. Simvastatin 40 a day.  3. Lortab b.i.d.  4. Sertraline 50 a day.  5. Aspirin a day.   PHYSICAL EXAMINATION:  GENERAL:  Remarkable for a thin white female in  no distress.  Affect is appropriate.  VITAL SIGNS:  Weight is 139.  Blood pressure 100/80, pulse is 90 and  regular, afebrile.  HEENT:  Unremarkable.  NECK:  Carotids normal without bruit.  No lymphadenopathy, thyromegaly,  JVP elevation.  LUNGS:  Clear to diaphragmatic motion.  No active wheezing.  CARDIOVASCULAR:  S1-S2 with normal heart sounds.  PMI normal.  ABDOMEN:  Benign.  Bowel sounds positive.  No hepatosplenomegaly,  hepatojugular reflexes.  EXTREMITIES:  Distal pulses intact.  No edema.  NEUROLOGIC:  Nonfocal.  SKIN:  Warm and dry.  No muscular weakness.   EKG shows sinus rhythm at a rate of 95 with poor R wave progression and  nonspecific ST-T wave changes.   IMPRESSION:  1. Persistent chest pain.  We will try to get the results of her      Myoview from November.  However, right and left heart      catheterization is indicated due to risk factors and ongoing chest      pain with an abnormal electrocardiogram.  2. Dyspnea related to chronic obstructive pulmonary disease and      ongoing smoking.  Will have a better idea is she can take a  Nicotrol patch once we rule out coronary artery disease.  She would      be a candidate for Wellbutrin.  3. Hypertension, currently well-controlled.  Continue low-salt diet.  4. Relative palpitations and tachycardia previously tried on Cardizem      by Dr. Antoine Poche, not tolerated.  Depending on results of her heart      catheterization, we may have to try a low dose of a sympathetic      beta blocker.  5. Hypercholesterolemia.  Continue simvastatin.  Lipid and liver      profile in 6 months.  May need to be more aggressive depending on      results of coronary arteriography.     [name]. Eden Emms, MD, [facility]  Electronically Signed    PCN/MedQ  DD: 03/12/2008  DT: 03/12/2008  Job #: [id number]

## 2011-04-07 NOTE — Assessment & Plan Note (Signed)
[name] is back regarding her neck and low back pain.  We had started a  fentanyl patch at last visit, which she only took for 12 days, as she  did not see an improvement, and, therefore, she stopped.  She also  reports that she might have had some nausea.  She stopped her Soma, as  well, as it did not help after 8-10 days.  She uses 1-2 Lortab a day,  which she finds beneficial, but she does not want to take more than  this, as she is afraid of addiction.  The patient relates her pain as 6-  7/10.  The pain is primarily today in her low back.  She does not have  significant radiation.  The pain is worse with walking, bending,  sitting, and also is bothersome at nighttime when she lies flat on her  back.  She is limited in her sleep due to pain.  She does report that  her mattress is quite old.  She denies symptoms in the legs, feet, arms  or hands.  Neck pain may be a bit better at this point.   REVIEW OF SYSTEMS:  The patient reports trouble walking, spasms,  tingling, weight gain, shortness of breath due to her COPD.   SOCIAL HISTORY:  The patient is married.  She is still smoking 3  cigarettes a day.   PHYSICAL EXAMINATION:  VITAL SIGNS:  Blood pressure is 115/72, pulse is  77, respiratory rate is 16.  She is satting 99% on room air.  GENERAL:  The patient is pleasant, in no acute distress.  She is alert  and oriented x3.  Affect is bright and appropriate.  Gait is stable.  She has pain with palpation over both PSIS areas, right more so than  left, as well as lumbar paraspinals.  Patrick's test was equivocal to  positive, as was the compression test.  Straight leg testing revealed  significant hamstring tightness.  Facet maneuvers were provocative, as  well, right more so than left today.  She continues to have a  significant level of scoliosis of the lumbar thoracic spine, as well.  Neck and shoulder posture was fair.  HEART:  Regular.  CHEST:  Clear.  ABDOMEN:  Soft, nontender.  NEUROLOGIC:  Cognitively, she was intact.   ASSESSMENT:  1. Multifactorial cervical and lumbar spine pain.  Low back pain seems      to be more facet in nature, although there appear to be SI joint      components, as well.  2. Depression.   PLAN:  1. I would like to initiate a short trial of physical therapy at [facility] to work on posture and home exercise program.  She is      increasing her walking, which is good, but she needs to work on      lumbar/core muscle strengthening and stretching.  2. Refill Lortab 10/500, #60, for breakthrough pain.  3. Add Celebrex 200 mg b.i.d. for any inflammatory effects.  4. Will see the patient back in about 6 weeks time for follow up.  I      still think she can benefit from injections, but she remains      resistant to these.  A follow up urine drug screen was performed      today.      Ranelle Oyster, M.D.  Electronically Signed     ZTS/MedQ  D:  12/26/2006 13:06:03  T:  12/26/2006 14:58:39  Job #:  [id number]   cc:   Alfredia Client, MD  Fax: [phone]
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
## 2011-04-07 NOTE — Assessment & Plan Note (Signed)
INTERVAL HISTORY:  [name] is back regarding her chronic low back pain.  She states that Celebrex was unhelpful.  She did not go to [facility]  therapies because of a newly diagnosed tachycardia.  There was some  question of irregularity.  She was placed on the beta blocker but seems  worried about side effects from medication.  She is scheduled to see a  cardiologist this week.  Pain remains a 6-7/10.  The Lortab seems to  cover pain to a great extent.  She uses one or two a day.  The pain is  mostly in the low back and left gluteal region.  She describes the pain  as stabbing and aching.  Pain affects her most in the morning.   REVIEW OF SYSTEMS:  Patient denies any new issues other than those  mentioned above.  She occasionally has some lower extremity swelling.   SOCIAL HISTORY:  The patient is married and husband is supportive.  She  still smokes.   PHYSICAL EXAMINATION:  Blood pressure is 129/68, pulse is 109,  respiratory rate 16, she is saturating 97% room air.  The patient is  pleasant, a bit flat, in no acute distress.  She walks with a shuffling  rigid gait.  She has difficulty bending more than about 15 to 20 degrees  at the low back.  Extension is minimal at 5 degrees before pain is  provoked.  She has tenderness along the left lumbar paraspinal's and  left flank.  PSIS areas were slightly tender.   Piriformis area was nontender.  Greater trochanters were nontender.  She  had near 4-5/5 strength with some pain inhibition noted proximally.  Sensory exam was intact in both legs.  Reflexes are 2+.  Patient walks  with a slightly wide-based gait as well.  Heart was regular.  Chest was  clear.  Abdomen soft, nontender.   ASSESSMENT:  1. Multifactorial cervical and lumbar spine pain.  The patient      continues to have facet signs in the low back although there are      likely other causes as well.  She may have an sacroiliac joint      piece additionally.  She is resistant to  any further injections at      this point.  2. Depression.  3. Tachycardia.   PLAN:  1. Initiate physical therapy once again once patient is cleared by      cardiology.  Need to work on range of motion, core muscle      strengthening, etc.  2. Refill Lortab 10/500 one q.12 h. p.r.n.  3. Will not pursue anymore aggressive narcotic or interventional      treatment at this time per patient preference.  4. I will see her back in about 2 months.      Ranelle Oyster, M.D.  Electronically Signed     ZTS/MedQ  D:  02/04/2007 13:27:32  T:  02/04/2007 22:06:39  Job #:  [id number]   cc:   Alfredia Client, MD  Fax: [phone]

## 2011-07-04 IMAGING — CR DG CHEST 2V
2 series · 2 of 2 positions shown · non-contrast
Comparison: Two-view chest x-ray 12/02/2008 and 03/12/2008.

CLINICAL DATA: Mid chest pain.  Smoker.

CHEST - 2 VIEW 09/24/2009:

[view not recorded (1 of 2)]
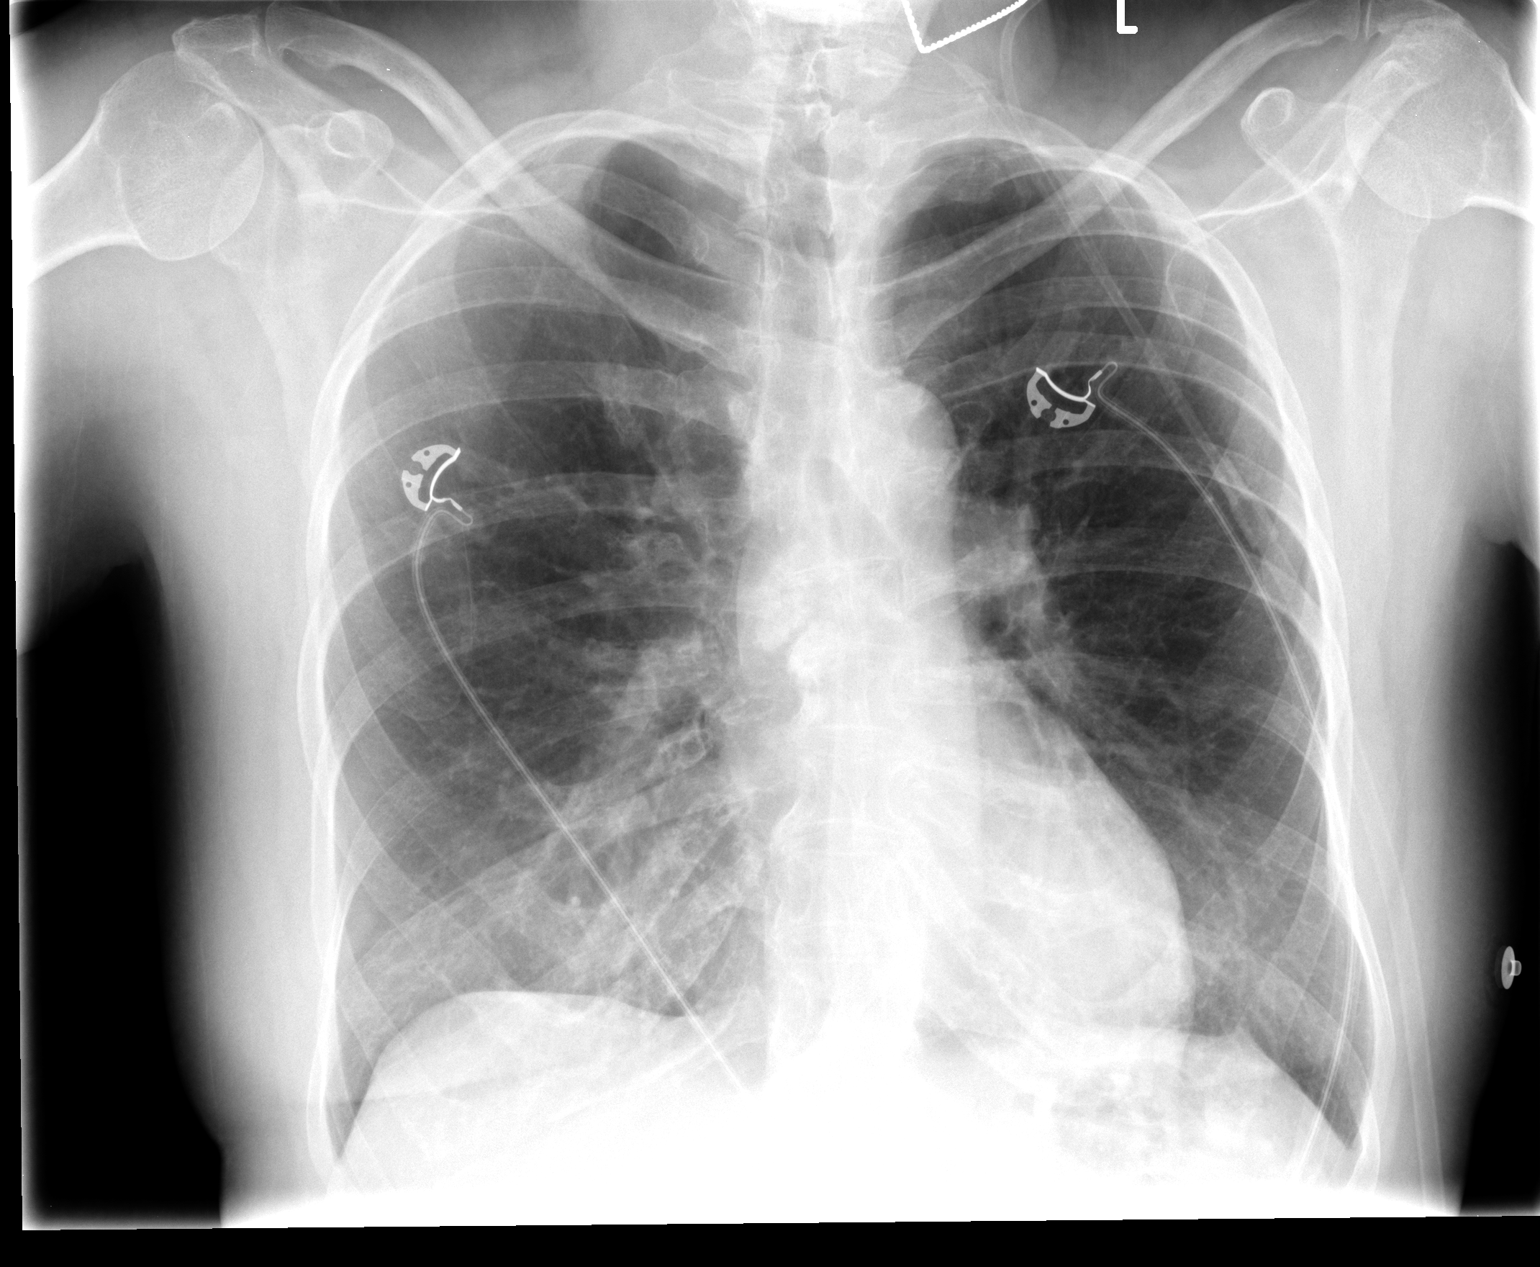

[view not recorded (2 of 2)]
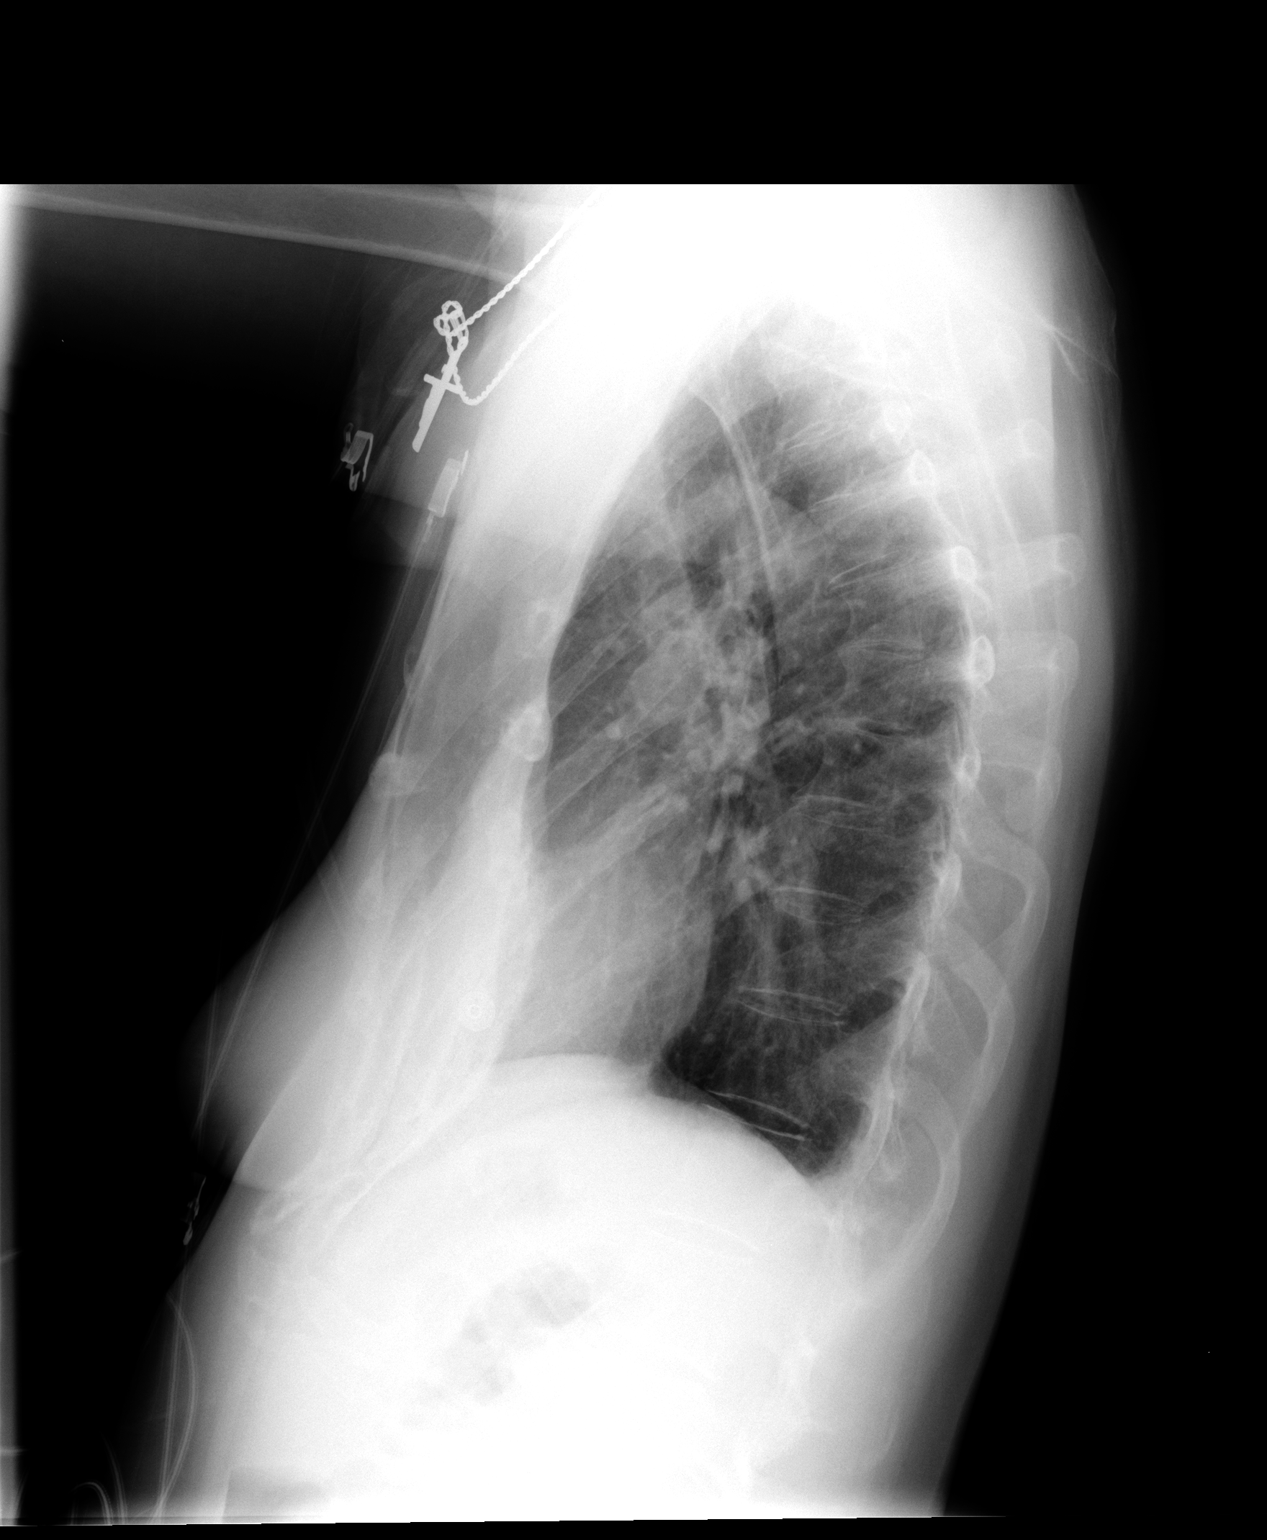

[2 of 2 positions shown; findings below may reference images not displayed]

FINDINGS: Heart size normal, accentuated by the severe pectus
excavatum sternal deformity.  Hilar and mediastinal contours
unremarkable and unchanged.  Mild hyperinflation and emphysematous
changes in the upper lobes, unchanged.  Lungs clear.  No pleural
effusions.  Degenerative changes throughout the thoracic spine.
IMPRESSION: COPD/emphysema.  No acute cardiopulmonary disease.

## 2011-07-31 IMAGING — CT CT HEAD W/O CM
1 series · 16 of 30 positions shown, 20 images · non-contrast
Comparison: 07/16/2008

CLINICAL DATA: Seizure.  Anxiety.

CT HEAD WITHOUT CONTRAST
TECHNIQUE: Contiguous axial images were obtained from the base of
the skull through the vertex without contrast.

[Series 2: headtrauma 4.8 h37s · axial · 0.43mm/px · z∈[+76,+236]mm · 16 of 36 slices shown, 20 images]
[im 2/36  brain]
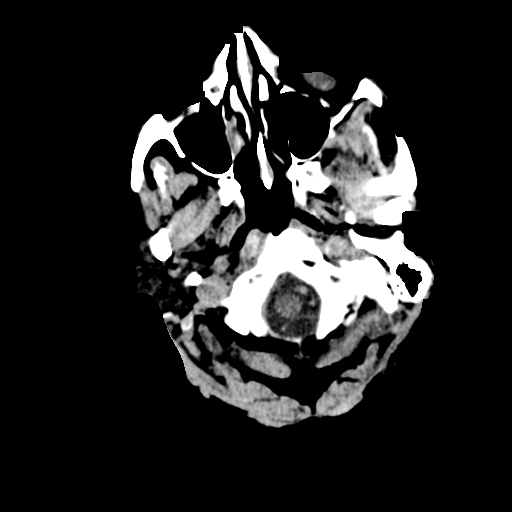
[im 2/36  bone]
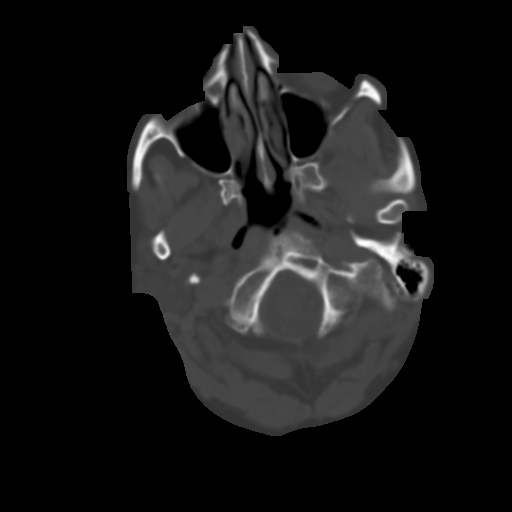
[im 4/36  brain]
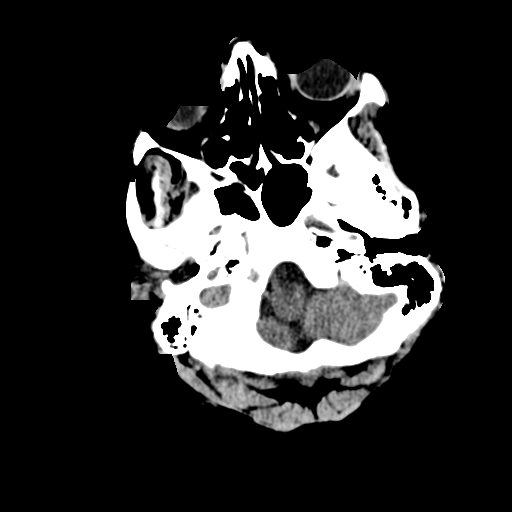
[im 7/36  brain]
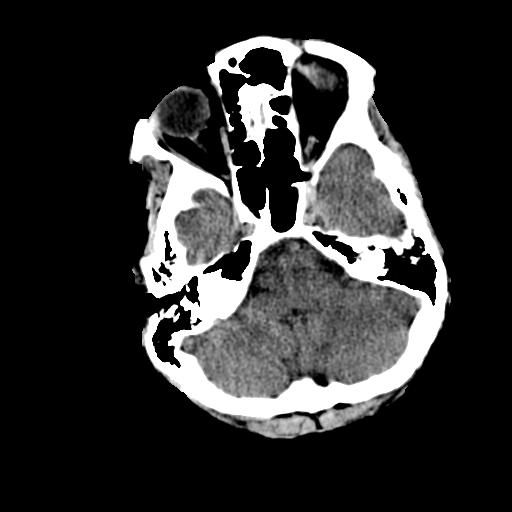
[im 9/36  brain]
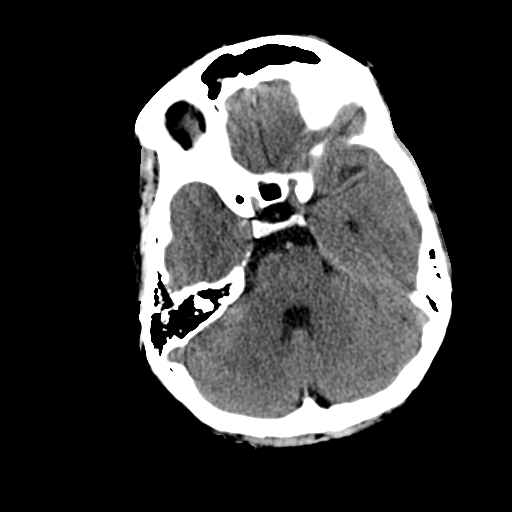
[im 10/36  brain]
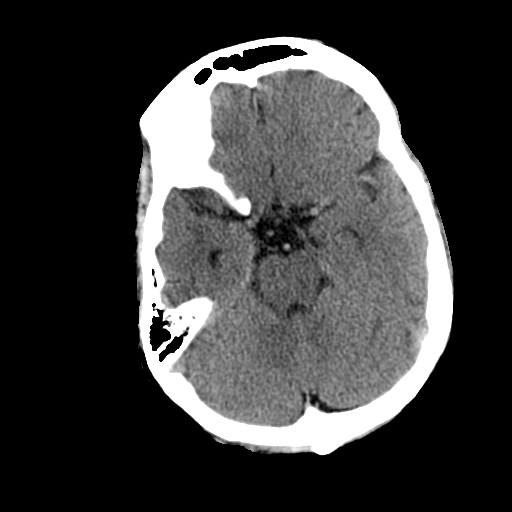
[im 10/36  bone]
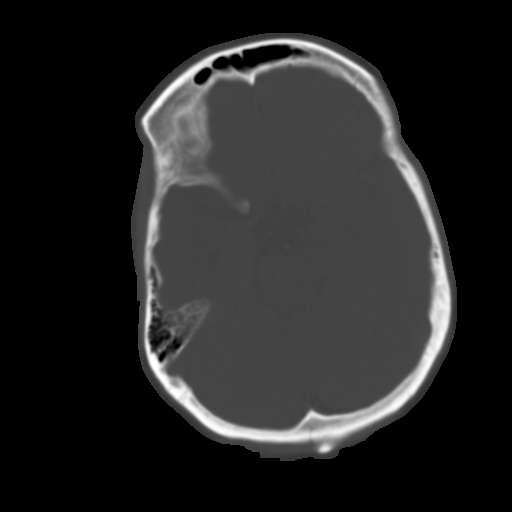
[im 13/36  brain]
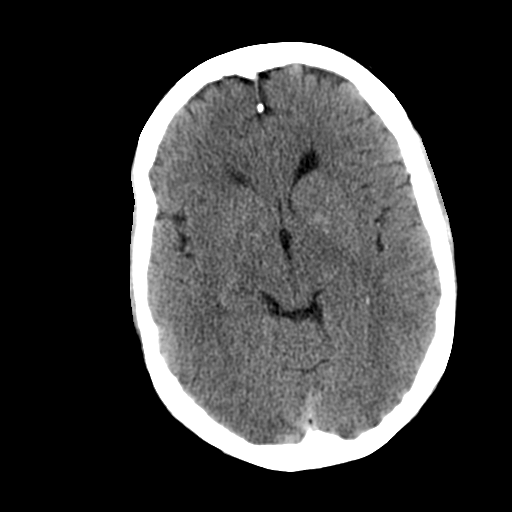
[im 15/36  brain]
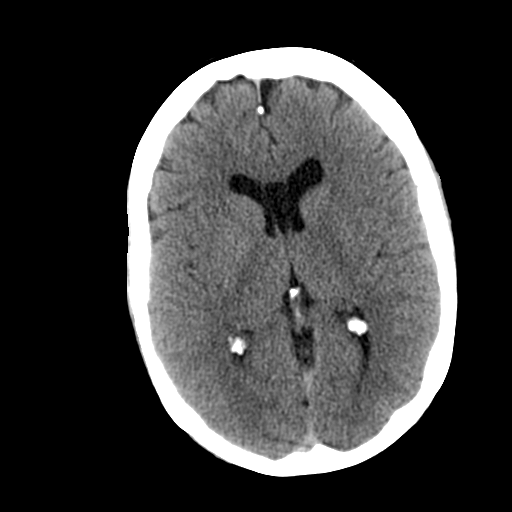
[im 17/36  brain]
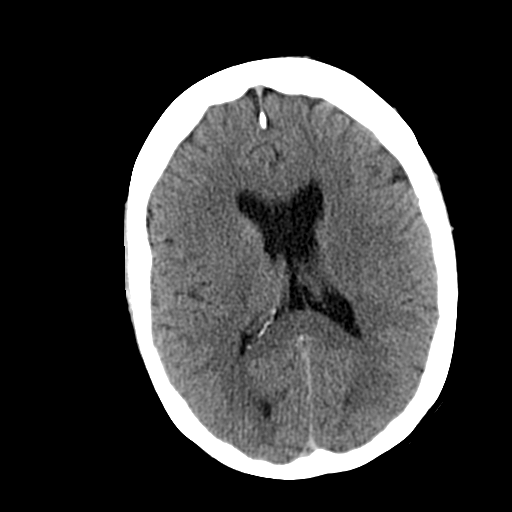
[im 19/36  brain]
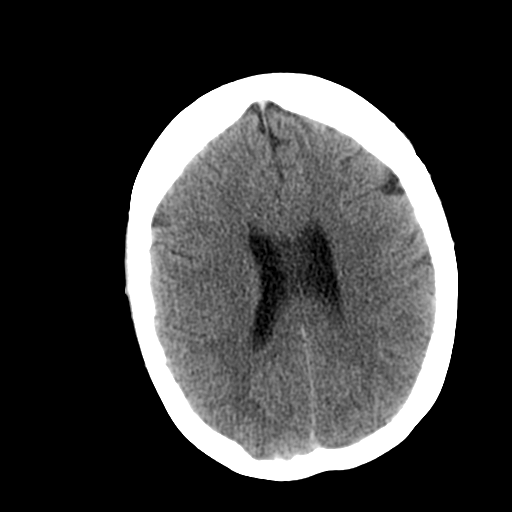
[im 19/36  bone]
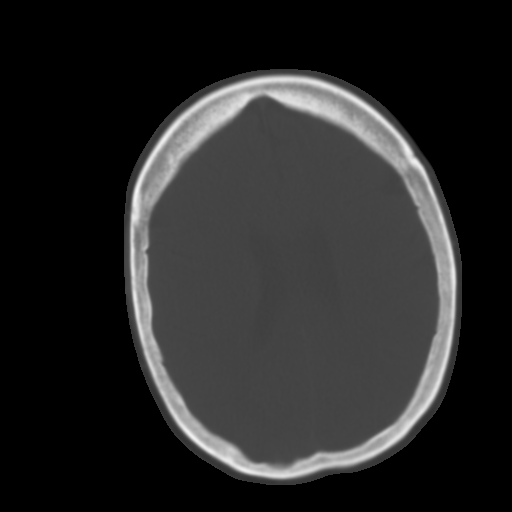
[im 21/36  brain]
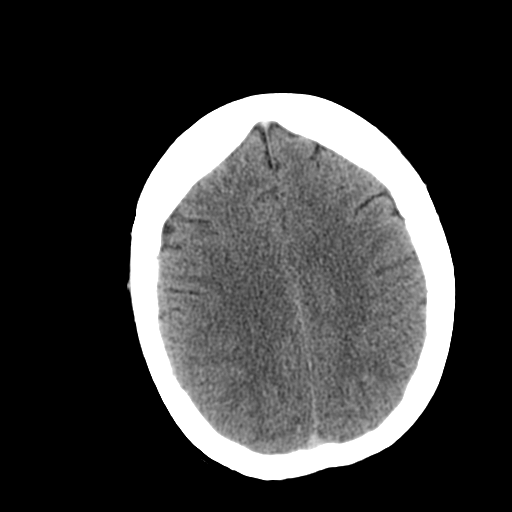
[im 23/36  brain]
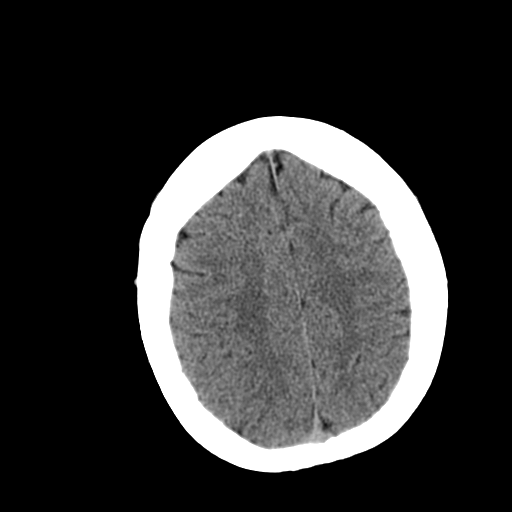
[im 26/36  brain]
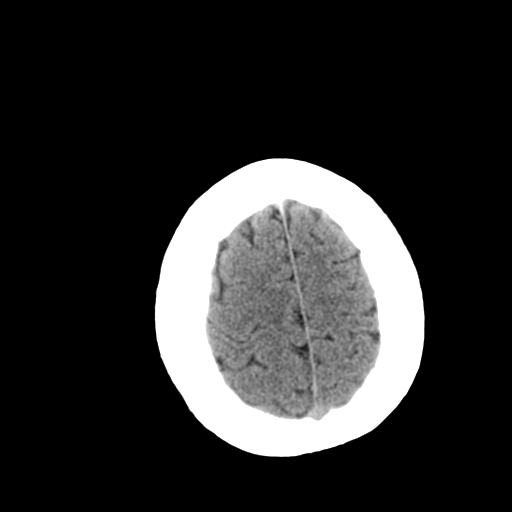
[im 27/36  brain]
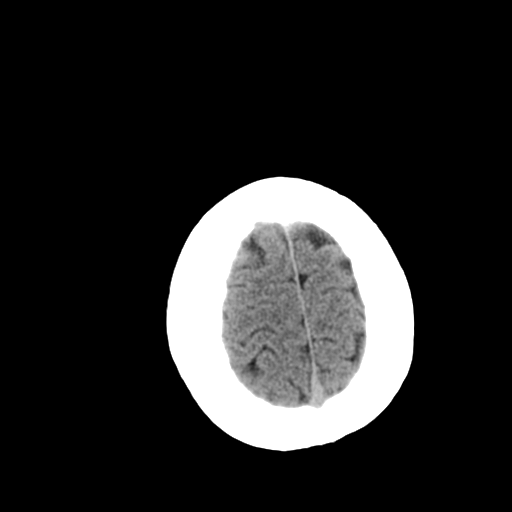
[im 27/36  bone]
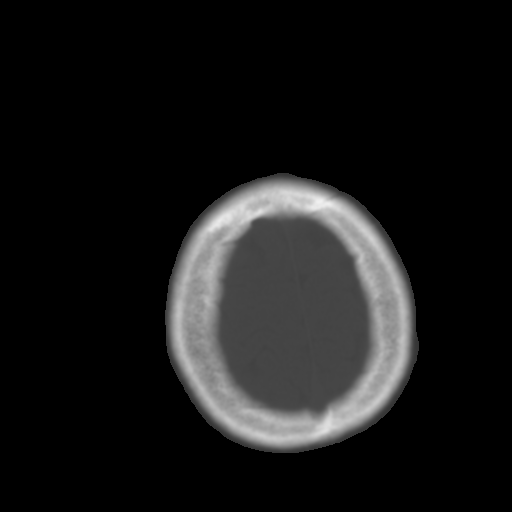
[im 29/36  brain]
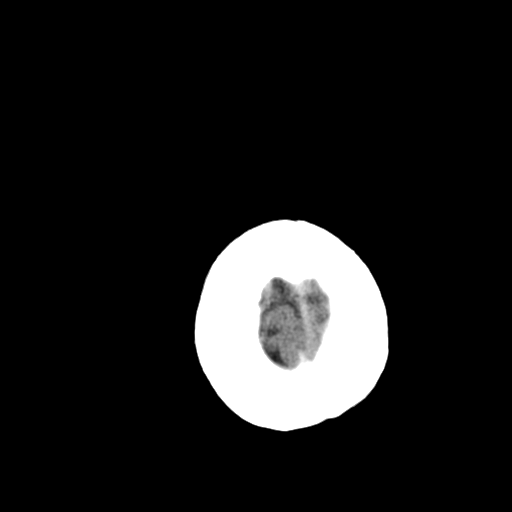
[im 32/36  brain]
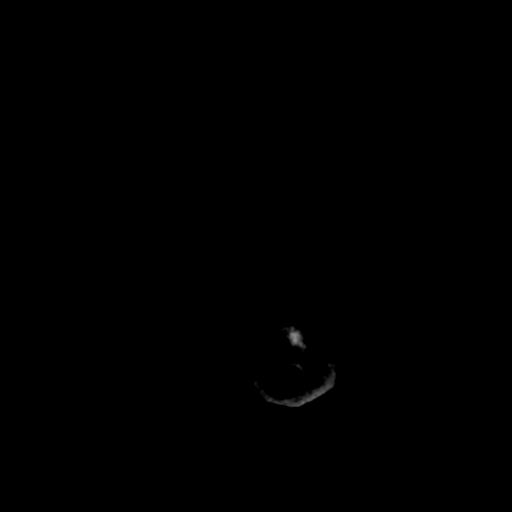
[im 34/36  brain]
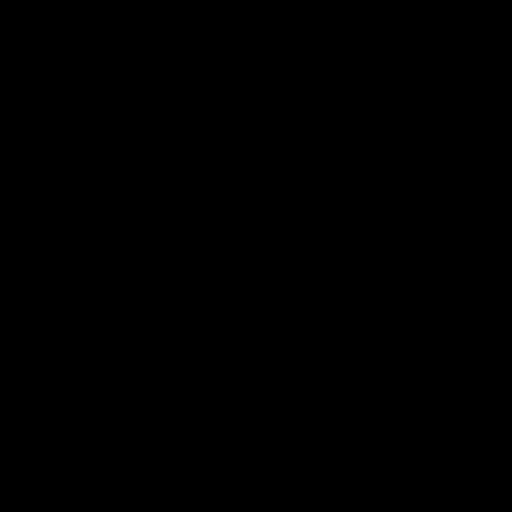

[16 of 30 positions shown; findings below may reference images not displayed]

FINDINGS: Stable early mineralization in the basal ganglia. There
is no evidence of acute intracranial hemorrhage, brain edema, mass
lesion, acute infarction,   mass effect, or midline shift. Acute
infarct may be inapparent on noncontrast CT.  No other intra-axial
abnormalities are seen, and the ventricles and sulci are within
normal limits in size and symmetry.   No abnormal extra-axial fluid
collections or masses are identified.  No significant calvarial
abnormality.
IMPRESSION: Negative for bleed or other acute intracranial process.

## 2011-08-18 LAB — CBC
HCT: 42.8
Hemoglobin: 14.4
MCHC: 33.5
MCV: 89.2
RBC: 4.79
RDW: 11.9

## 2011-08-18 LAB — BASIC METABOLIC PANEL WITH GFR
BUN: 6
CO2: 29
Calcium: 9.6
Chloride: 104
Creatinine, Ser: 0.81
GFR calc non Af Amer: >60
Glucose, Bld: 102 — ABNORMAL HIGH
Potassium: 4.2
Sodium: 137

## 2011-08-18 LAB — DIFFERENTIAL
Basophils Relative: 1
Eosinophils Absolute: 0.2
Eosinophils Relative: 2
Monocytes Absolute: 0.6
Monocytes Relative: 6

## 2011-08-18 LAB — POCT CARDIAC MARKERS
CKMB, poc: 1.2
Myoglobin, poc: 51.6
Troponin i, poc: <0.05

## 2011-08-22 LAB — URINE MICROSCOPIC-ADD ON

## 2011-08-22 LAB — URINALYSIS, ROUTINE W REFLEX MICROSCOPIC
Bilirubin Urine: NEGATIVE
Glucose, UA: NEGATIVE
Nitrite: NEGATIVE
Specific Gravity, Urine: 1.01
pH: 6

## 2011-08-23 LAB — DIFFERENTIAL
Basophils Absolute: 0.1
Lymphocytes Relative: 32
Neutro Abs: 6.1

## 2011-08-23 LAB — BASIC METABOLIC PANEL
BUN: 9
Calcium: 9.4
GFR calc non Af Amer: >60
Glucose, Bld: 99
Sodium: 135

## 2011-08-23 LAB — CBC
Hemoglobin: 14.4
Platelets: 265
RDW: 12.1
WBC: 10.5

## 2011-08-23 LAB — POCT CARDIAC MARKERS
Myoglobin, poc: 46.7
Troponin i, poc: <0.05

## 2011-08-25 LAB — URINALYSIS, ROUTINE W REFLEX MICROSCOPIC
Bilirubin Urine: NEGATIVE
Ketones, ur: NEGATIVE mg/dL
Nitrite: NEGATIVE
pH: 5 (ref 5.0–8.0)

## 2011-08-25 LAB — CBC
HCT: 39.9 % (ref 36.0–46.0)
Hemoglobin: 13.7 g/dL (ref 12.0–15.0)
MCHC: 34.3 g/dL (ref 30.0–36.0)
MCV: 89.4 fL (ref 78.0–100.0)
RDW: 12 % (ref 11.5–15.5)

## 2011-08-25 LAB — COMPREHENSIVE METABOLIC PANEL
ALT: 19 U/L (ref 0–35)
AST: 22 U/L (ref 0–37)
Albumin: 4 g/dL (ref 3.5–5.2)
Alkaline Phosphatase: 97 U/L (ref 39–117)
BUN: 11 mg/dL (ref 6–23)
Chloride: 106 mEq/L (ref 96–112)
Potassium: 4.4 mEq/L (ref 3.5–5.1)
Total Bilirubin: 0.4 mg/dL (ref 0.3–1.2)

## 2011-08-25 LAB — DIFFERENTIAL
Basophils Absolute: 0.1 10*3/uL (ref 0.0–0.1)
Basophils Relative: 1 % (ref 0–1)
Eosinophils Relative: 2 % (ref 0–5)
Lymphocytes Relative: 38 % (ref 12–46)
Monocytes Absolute: 0.6 10*3/uL (ref 0.1–1.0)

## 2011-08-25 LAB — URINE MICROSCOPIC-ADD ON

## 2011-08-29 LAB — DIFFERENTIAL
Basophils Absolute: 0
Basophils Absolute: 0.1
Basophils Relative: 0
Basophils Relative: 1
Eosinophils Absolute: 0
Eosinophils Absolute: 0.2
Eosinophils Relative: 0
Eosinophils Relative: 1
Lymphocytes Relative: 12
Lymphocytes Relative: 37
Lymphs Abs: 1.3
Lymphs Abs: 4.4 — ABNORMAL HIGH
Monocytes Absolute: 0.3
Monocytes Absolute: 0.9 — ABNORMAL HIGH
Monocytes Relative: 2 — ABNORMAL LOW
Monocytes Relative: 8
Neutro Abs: 6.4
Neutro Abs: 9.6 — ABNORMAL HIGH
Neutrophils Relative %: 53
Neutrophils Relative %: 86 — ABNORMAL HIGH

## 2011-08-29 LAB — POCT CARDIAC MARKERS
CKMB, poc: <1 — ABNORMAL LOW
CKMB, poc: <1 — ABNORMAL LOW
Myoglobin, poc: 49.5
Troponin i, poc: <0.05
Troponin i, poc: <0.05

## 2011-08-29 LAB — CBC
HCT: 37.5
HCT: 37.6
Hemoglobin: 12.8
Hemoglobin: 12.8
MCHC: 34
MCHC: 34.2
MCV: 87.9
MCV: 88.3
Platelets: 302
Platelets: 314
RBC: 4.25
RBC: 4.26
RDW: 11.6
RDW: 12.1
WBC: 11.2 — ABNORMAL HIGH
WBC: 11.9 — ABNORMAL HIGH

## 2011-08-29 LAB — HEPATIC FUNCTION PANEL
ALT: 17
Alkaline Phosphatase: 89
Indirect Bilirubin: 0.2 — ABNORMAL LOW
Total Bilirubin: 0.3
Total Protein: 6.8

## 2011-08-29 LAB — CARDIAC PANEL(CRET KIN+CKTOT+MB+TROPI)
CK, MB: 0.8
CK, MB: 0.9
CK, MB: 1
Relative Index: INVALID
Relative Index: INVALID
Total CK: 48
Total CK: 52
Troponin I: 0.01

## 2011-08-29 LAB — BASIC METABOLIC PANEL WITH GFR
BUN: 4 — ABNORMAL LOW
BUN: 9
CO2: 28
CO2: 29
Calcium: 9.1
Calcium: 9.6
Chloride: 107
Chloride: 107
Creatinine, Ser: 0.67
Creatinine, Ser: 0.68
GFR calc non Af Amer: >60
GFR calc non Af Amer: >60
Glucose, Bld: 142 — ABNORMAL HIGH
Glucose, Bld: 85
Potassium: 3.9
Potassium: 4.7
Sodium: 141
Sodium: 143

## 2011-08-29 LAB — I-STAT 8, (EC8 V) (CONVERTED LAB)
Acid-Base Excess: 3 — ABNORMAL HIGH
BUN: 5 — ABNORMAL LOW
Bicarbonate: 27.4 — ABNORMAL HIGH
Chloride: 104
Glucose, Bld: 115 — ABNORMAL HIGH
HCT: 44
Hemoglobin: 15
Operator id: 166561
Potassium: 3.9
Sodium: 138
TCO2: 29
pCO2, Ven: 39.2 — ABNORMAL LOW
pH, Ven: 7.453 — ABNORMAL HIGH

## 2011-08-29 LAB — B-NATRIURETIC PEPTIDE (CONVERTED LAB): Pro B Natriuretic peptide (BNP): <30

## 2011-08-29 LAB — PROTIME-INR
INR: 0.9
Prothrombin Time: 12.4

## 2011-08-29 LAB — D-DIMER, QUANTITATIVE: D-Dimer, Quant: 0.22

## 2011-08-29 LAB — LIPASE, BLOOD: Lipase: 22

## 2011-08-31 LAB — URINALYSIS, ROUTINE W REFLEX MICROSCOPIC
Bilirubin Urine: NEGATIVE
Hgb urine dipstick: NEGATIVE
Ketones, ur: NEGATIVE
Nitrite: NEGATIVE
Specific Gravity, Urine: 1.015
pH: 7

## 2011-08-31 LAB — LIPASE, BLOOD: Lipase: 28

## 2011-08-31 LAB — DIFFERENTIAL
Eosinophils Absolute: 0.2
Eosinophils Relative: 2
Lymphs Abs: 3.9 — ABNORMAL HIGH
Monocytes Absolute: 0.8 — ABNORMAL HIGH

## 2011-08-31 LAB — CBC
Hemoglobin: 12.9
MCHC: 34.2
Platelets: 291
RDW: 12

## 2011-08-31 LAB — COMPREHENSIVE METABOLIC PANEL
ALT: 17
AST: 19
Albumin: 3.8
CO2: 29
Calcium: 8.8
Chloride: 105
GFR calc Af Amer: >60
GFR calc non Af Amer: >60
Sodium: 139
Total Bilirubin: 0.4

## 2011-09-05 LAB — COMPREHENSIVE METABOLIC PANEL
AST: 26
BUN: 7
CO2: 25
Calcium: 9.3
Chloride: 103
Creatinine, Ser: 0.71
GFR calc Af Amer: >60
GFR calc non Af Amer: >60
Glucose, Bld: 112 — ABNORMAL HIGH
Total Bilirubin: 0.6

## 2011-09-05 LAB — DIFFERENTIAL
Basophils Absolute: 0.1
Eosinophils Relative: 1
Lymphocytes Relative: 33
Lymphs Abs: 3.6 — ABNORMAL HIGH
Neutrophils Relative %: 61

## 2011-09-05 LAB — CBC
HCT: 42.2
Hemoglobin: 14.6
MCHC: 34.6
MCV: 86.9
RBC: 4.86
WBC: 11.1 — ABNORMAL HIGH

## 2011-09-05 LAB — POCT CARDIAC MARKERS
Myoglobin, poc: 50.8
Operator id: 106841
Operator id: 179121
Troponin i, poc: <0.05
Troponin i, poc: <0.05

## 2011-11-20 ENCOUNTER — Encounter: Payer: Self-pay | Admitting: Cardiology

## 2013-04-13 DIAGNOSIS — M8580 Other specified disorders of bone density and structure, unspecified site: Secondary | ICD-10-CM | POA: Insufficient documentation

## 2013-04-13 DIAGNOSIS — M5030 Other cervical disc degeneration, unspecified cervical region: Secondary | ICD-10-CM | POA: Insufficient documentation

## 2013-04-13 DIAGNOSIS — M949 Disorder of cartilage, unspecified: Secondary | ICD-10-CM | POA: Insufficient documentation

## 2013-04-13 DIAGNOSIS — M899 Disorder of bone, unspecified: Secondary | ICD-10-CM | POA: Insufficient documentation

## 2013-04-13 DIAGNOSIS — G894 Chronic pain syndrome: Secondary | ICD-10-CM | POA: Insufficient documentation

## 2013-06-23 DIAGNOSIS — I10 Essential (primary) hypertension: Secondary | ICD-10-CM | POA: Insufficient documentation

## 2013-06-25 DIAGNOSIS — S62513A Displaced fracture of proximal phalanx of unspecified thumb, initial encounter for closed fracture: Secondary | ICD-10-CM | POA: Insufficient documentation

## 2013-08-18 DIAGNOSIS — M1710 Unilateral primary osteoarthritis, unspecified knee: Secondary | ICD-10-CM | POA: Insufficient documentation

## 2014-03-24 DIAGNOSIS — N289 Disorder of kidney and ureter, unspecified: Secondary | ICD-10-CM | POA: Insufficient documentation

## 2014-07-06 DIAGNOSIS — M7062 Trochanteric bursitis, left hip: Secondary | ICD-10-CM | POA: Insufficient documentation

## 2014-07-06 DIAGNOSIS — M76899 Other specified enthesopathies of unspecified lower limb, excluding foot: Secondary | ICD-10-CM | POA: Insufficient documentation

## 2014-07-29 DIAGNOSIS — M170 Bilateral primary osteoarthritis of knee: Secondary | ICD-10-CM | POA: Insufficient documentation

## 2014-12-28 DIAGNOSIS — I679 Cerebrovascular disease, unspecified: Secondary | ICD-10-CM | POA: Insufficient documentation

## 2015-01-07 DIAGNOSIS — M7552 Bursitis of left shoulder: Secondary | ICD-10-CM | POA: Insufficient documentation

## 2015-06-21 DIAGNOSIS — D3001 Benign neoplasm of right kidney: Secondary | ICD-10-CM | POA: Insufficient documentation

## 2015-06-21 DIAGNOSIS — D1771 Benign lipomatous neoplasm of kidney: Secondary | ICD-10-CM | POA: Insufficient documentation

## 2015-06-21 DIAGNOSIS — N3281 Overactive bladder: Secondary | ICD-10-CM | POA: Insufficient documentation

## 2015-10-06 DIAGNOSIS — Q6671 Congenital pes cavus, right foot: Secondary | ICD-10-CM | POA: Insufficient documentation

## 2016-07-03 DIAGNOSIS — M47816 Spondylosis without myelopathy or radiculopathy, lumbar region: Secondary | ICD-10-CM | POA: Insufficient documentation

## 2016-08-29 DIAGNOSIS — E782 Mixed hyperlipidemia: Secondary | ICD-10-CM | POA: Insufficient documentation

## 2016-08-29 DIAGNOSIS — F17210 Nicotine dependence, cigarettes, uncomplicated: Secondary | ICD-10-CM | POA: Insufficient documentation

## 2017-04-10 DIAGNOSIS — J4520 Mild intermittent asthma, uncomplicated: Secondary | ICD-10-CM | POA: Insufficient documentation

## 2017-10-17 DIAGNOSIS — K2100 Gastro-esophageal reflux disease with esophagitis, without bleeding: Secondary | ICD-10-CM | POA: Insufficient documentation

## 2017-12-07 DIAGNOSIS — M4712 Other spondylosis with myelopathy, cervical region: Secondary | ICD-10-CM | POA: Insufficient documentation

## 2018-01-02 DIAGNOSIS — K2270 Barrett's esophagus without dysplasia: Secondary | ICD-10-CM | POA: Insufficient documentation

## 2018-07-01 ENCOUNTER — Emergency Department
Admission: EM | Admit: 2018-07-01 | Discharge: 2018-07-01 | Disposition: A | Discharge status: Home / Self Care | Payer: Medicaid Other | Attending: Emergency Medicine | Admitting: Emergency Medicine

## 2018-07-01 ENCOUNTER — Encounter: Payer: Self-pay | Admitting: Emergency Medicine

## 2018-07-01 ENCOUNTER — Other Ambulatory Visit: Payer: Self-pay

## 2018-07-01 DIAGNOSIS — J449 Chronic obstructive pulmonary disease, unspecified: Secondary | ICD-10-CM | POA: Diagnosis not present

## 2018-07-01 DIAGNOSIS — Z7982 Long term (current) use of aspirin: Secondary | ICD-10-CM | POA: Diagnosis not present

## 2018-07-01 DIAGNOSIS — G8929 Other chronic pain: Secondary | ICD-10-CM | POA: Insufficient documentation

## 2018-07-01 DIAGNOSIS — Z79899 Other long term (current) drug therapy: Secondary | ICD-10-CM | POA: Diagnosis not present

## 2018-07-01 DIAGNOSIS — H1031 Unspecified acute conjunctivitis, right eye: Secondary | ICD-10-CM | POA: Diagnosis not present

## 2018-07-01 DIAGNOSIS — H5711 Ocular pain, right eye: Secondary | ICD-10-CM | POA: Diagnosis present

## 2018-07-01 DIAGNOSIS — M545 Low back pain: Secondary | ICD-10-CM | POA: Insufficient documentation

## 2018-07-01 MED ORDER — OXYCODONE-ACETAMINOPHEN 5-325 MG PO TABS
1.0000 | ORAL_TABLET | Freq: Once | ORAL | Status: AC
  Administered 2018-07-01: 1 via ORAL
  Filled 2018-07-01: qty 1

## 2018-07-01 MED ORDER — ERYTHROMYCIN 5 MG/GM OP OINT: 1.0000 "application " | TOPICAL_OINTMENT | Freq: Four times a day (QID) | OPHTHALMIC | 0 refills | Status: DC

## 2018-07-01 MED ORDER — TETRACAINE HCL 0.5 % OP SOLN
2.0000 [drp] | Freq: Once | OPHTHALMIC | Status: AC
  Administered 2018-07-01: 2 [drp] via OPHTHALMIC
  Filled 2018-07-01: qty 4

## 2018-07-01 MED ORDER — FLUORESCEIN SODIUM 1 MG OP STRP
1.0000 | ORAL_STRIP | Freq: Once | OPHTHALMIC | Status: AC
  Administered 2018-07-01: 1 via OPHTHALMIC
  Filled 2018-07-01: qty 1

## 2018-07-01 NOTE — ED Notes (Signed)
First Nurse Note: Patient to ED via [facility] with complaint of chronic back pain.

## 2018-07-01 NOTE — ED Triage Notes (Signed)
Pt arrived via EMS from home with fiancee who is also being seen. Pt states she has chronic back pain with 2 herniated discs. Pt states she is new to [facility] and does not have her medicaid set up yet.  Pt states she was seeing a Psychologist, sport and exercise in [place] who told she needed surgery. Pt was not getting pain meds from surgeon because she was going to pain clinic. Pt hasn't had pain meds in several days.  Pt also reports right eye pain and redness for 2 days. Pt has hx of cataract and possible implant in one of her eyes.  No distress noted in triage. On arrival to ED from EMS pt went to the bathroom.

## 2018-07-01 NOTE — ED Notes (Signed)
See triage note  Presents with redness and drainage noted to right eye  Also having lower back pain  States she recently moved here and waiting on insurance  staes she needs to see a Psychologist, sport and exercise for her back  Ambulates well to treatment

## 2018-07-01 NOTE — ED Provider Notes (Signed)
[facility] Emergency Department Provider Note  ____________________________________________  Time seen: Approximately 11:36 AM  I have reviewed the triage vital signs and the nursing notes.   HISTORY  Chief Complaint Back Pain and Eye Problem    HPI [name] is a 62 y.o. female presents to emergency department for evaluation of  right eye irritation for 2 days.  Patient states that eye started out feeling itchy and is now draining yellow discharge. No trauma.  She wears glasses, no contacts.  Patient also states that she has chronic back pain and needs surgery on both her neck and back.  She saw a Psychologist, sport and exercise in [place] and will be following up with him again in about 5 days when her Medicaid goes through.  She was seeing a pain clinic but moved and has not seen them in about 5 months.  Back pain has not changed in character at all today.  No headache, photophobia, eye pain, nausea, vomiting.  Past Medical History:  Diagnosis Date  . Chronic low back pain   . COPD (chronic obstructive pulmonary disease) ([place])   . Depression   . Diverticulitis   . GERD (gastroesophageal reflux disease)   . High cholesterol   . Kidney tumor    Small  . Osteoporosis   . UTI (urinary tract infection)    Recurrent    Patient Active Problem List   Diagnosis Date Noted  . SMOKER 03/07/2010  . DYSPNEA 03/07/2010  . CHEST PAIN 03/07/2010  . ELECTROCARDIOGRAM, ABNORMAL 03/07/2010  . BACK PAIN 12/03/2008    Past Surgical History:  Procedure Laterality Date  . APPENDECTOMY    . CYSTECTOMY     [place]omb  . OVARIAN CYST REMOVAL    . PARTIAL COLECTOMY    . TUBAL LIGATION      Prior to Admission medications   Medication Sig Start Date End Date Taking? Authorizing Provider  albuterol (PROVENTIL) (5 MG/ML) 0.5% nebulizer solution Take 2.5 mg by nebulization 2 (two) times a week.      [provider]  albuterol-ipratropium (COMBIVENT) 18-103 MCG/ACT inhaler Inhale 1  puff into the lungs 3 (three) times daily.      [provider]  aspirin 325 MG tablet Take 325 mg by mouth daily.      [provider]  azithromycin (AZASITE) 1 % ophthalmic solution Place 1 drop into the left eye 2 (two) times daily.      [provider]  erythromycin ophthalmic ointment Place 1 application into the right eye 4 (four) times daily. 07/01/18   Laban Emperor, PA-C  naproxen (NAPROSYN) 375 MG tablet Take 375 mg by mouth 2 (two) times daily.      [provider]  omeprazole (PRILOSEC) 20 MG capsule Take 20 mg by mouth daily.      [provider]  traZODone (DESYREL) 100 MG tablet Take 100 mg by mouth at bedtime.      [provider]    Allergies Doxycycline and Penicillins  Family History  Problem Relation Age of Onset  . Arthritis Other   . Diabetes Other   . Heart disease Other     Social History Social History   Tobacco Use  . Smoking status: Not on file  Substance Use Topics  . Alcohol use: Not on file  . Drug use: Not on file     Review of Systems  Constitutional: No fever/chills Gastrointestinal: No abdominal pain.  No nausea, no vomiting.  Musculoskeletal: Positive  for back pain. Skin: Negative for rash, abrasions, lacerations, ecchymosis. Neurological: Negative for headaches   ____________________________________________   PHYSICAL EXAM:  VITAL SIGNS: ED Triage Vitals  Enc Vitals Group     BP 07/01/18 1115 112/74     Pulse Rate 07/01/18 1115 (!) 102     Resp 07/01/18 1115 18     Temp 07/01/18 1115 98.8 F (37.1 C)     Temp Source 07/01/18 1115 Oral     SpO2 07/01/18 1115 100 %     Weight 07/01/18 1113 128 lb (58.1 kg)     Height 07/01/18 1113 5\' 4"  (1.626 m)     Head Circumference --      Peak Flow --      Pain Score 07/01/18 1113 7     Pain Loc --      Pain Edu? --      Excl. in [place]? --      Constitutional: Alert and oriented. Well appearing and in no acute distress. Eyes:  Right eye is injected with thick yellow drainage and crusting to eyelashes and lid.  PERRL. EOMI. No defect on fluorescein stain.  No surrounding erythema edema or swelling. Head: Atraumatic. ENT:      Ears:      Nose: No congestion/rhinnorhea.      Mouth/Throat: Mucous membranes are moist.  Neck: No stridor.  Cardiovascular: Normal rate, regular rhythm.  Good peripheral circulation. Respiratory: Normal respiratory effort without tachypnea or retractions. Lungs CTAB. Good air entry to the bases with no decreased or absent breath sounds. Musculoskeletal: Full range of motion to all extremities. No gross deformities appreciated. Strength equal in lower extremities. Normal gait.  Neurologic:  Normal speech and language. No gross focal neurologic deficits are appreciated.  Skin:  Skin is warm, dry and intact. No rash noted. Psychiatric: Mood and affect are normal. Speech and behavior are normal. Patient exhibits appropriate insight and judgement.   ____________________________________________   LABS (all labs ordered are listed, but only abnormal results are displayed)  Labs Reviewed - No data to display ____________________________________________  EKG   ____________________________________________  RADIOLOGY   No results found.  ____________________________________________    PROCEDURES  Procedure(s) performed:    Procedures    Medications  fluorescein ophthalmic strip 1 strip (1 strip Right Eye Given 07/01/18 1158)  tetracaine (PONTOCAINE) 0.5 % ophthalmic solution 2 drop (2 drops Right Eye Given 07/01/18 1158)  oxyCODONE-acetaminophen (PERCOCET/ROXICET) 5-325 MG per tablet 1 tablet (1 tablet Oral Given 07/01/18 1226)     ____________________________________________   INITIAL IMPRESSION / ASSESSMENT AND PLAN / ED COURSE  Pertinent labs & imaging results that were available during my care of the patient were reviewed by me and considered in my medical decision  making (see chart for details).  Review of the [place] CSRS was performed in accordance of the [place] prior to dispensing any controlled drugs.    Patient's diagnosis is consistent with bacterial conjunctivitis. Vital signs and exam are reassuring. Patient will follow up with surgeon for chronic back pain. Patient will be discharged home with prescriptions for erythromycin ointment. Patient is to follow up with ortho and opthamology as directed. Patient is given ED precautions to return to the ED for any worsening or new symptoms.     ____________________________________________  FINAL CLINICAL IMPRESSION(S) / ED DIAGNOSES  Final diagnoses:  Acute bacterial conjunctivitis of right eye  Chronic bilateral low back pain without sciatica      NEW MEDICATIONS STARTED DURING THIS VISIT:  ED Discharge Orders         Ordered    erythromycin ophthalmic ointment  4 times daily     07/01/18 1213              This chart was dictated using voice recognition software/Dragon. Despite best efforts to proofread, errors can occur which can change the meaning. Any change was purely unintentional.    Laban Emperor, PA-C 07/01/18 Shirlee Limerick, MD 07/02/18 [phone]

## 2018-07-11 ENCOUNTER — Other Ambulatory Visit: Payer: Self-pay

## 2018-07-11 ENCOUNTER — Emergency Department
Admission: EM | Admit: 2018-07-11 | Discharge: 2018-07-11 | Disposition: A | Discharge status: Home / Self Care | Payer: Medicaid Other | Attending: Emergency Medicine | Admitting: Emergency Medicine

## 2018-07-11 ENCOUNTER — Encounter: Payer: Self-pay | Admitting: Emergency Medicine

## 2018-07-11 DIAGNOSIS — F329 Major depressive disorder, single episode, unspecified: Secondary | ICD-10-CM | POA: Insufficient documentation

## 2018-07-11 DIAGNOSIS — Z7982 Long term (current) use of aspirin: Secondary | ICD-10-CM | POA: Insufficient documentation

## 2018-07-11 DIAGNOSIS — H5789 Other specified disorders of eye and adnexa: Secondary | ICD-10-CM | POA: Diagnosis present

## 2018-07-11 DIAGNOSIS — M545 Low back pain, unspecified: Secondary | ICD-10-CM

## 2018-07-11 DIAGNOSIS — Z79899 Other long term (current) drug therapy: Secondary | ICD-10-CM | POA: Diagnosis not present

## 2018-07-11 DIAGNOSIS — H1013 Acute atopic conjunctivitis, bilateral: Secondary | ICD-10-CM

## 2018-07-11 DIAGNOSIS — F17200 Nicotine dependence, unspecified, uncomplicated: Secondary | ICD-10-CM | POA: Diagnosis not present

## 2018-07-11 DIAGNOSIS — G8929 Other chronic pain: Secondary | ICD-10-CM | POA: Diagnosis not present

## 2018-07-11 DIAGNOSIS — J449 Chronic obstructive pulmonary disease, unspecified: Secondary | ICD-10-CM | POA: Diagnosis not present

## 2018-07-11 DIAGNOSIS — H1045 Other chronic allergic conjunctivitis: Secondary | ICD-10-CM | POA: Insufficient documentation

## 2018-07-11 MED ORDER — OLOPATADINE HCL 0.1 % OP SOLN: 1.0000 [drp] | Freq: Two times a day (BID) | OPHTHALMIC | 12 refills | Status: DC

## 2018-07-11 MED ORDER — TETRACAINE HCL 0.5 % OP SOLN
1.0000 [drp] | Freq: Once | OPHTHALMIC | Status: AC
  Administered 2018-07-11: 1 [drp] via OPHTHALMIC
  Filled 2018-07-11: qty 4

## 2018-07-11 MED ORDER — FLUORESCEIN SODIUM 1 MG OP STRP
1.0000 | ORAL_STRIP | Freq: Once | OPHTHALMIC | Status: AC
  Administered 2018-07-11: 1 via OPHTHALMIC
  Filled 2018-07-11: qty 1

## 2018-07-11 MED ORDER — MELOXICAM 7.5 MG PO TABS: 7.5000 mg | ORAL_TABLET | Freq: Every day | ORAL | 0 refills | Status: DC

## 2018-07-11 MED ORDER — EYE WASH OPHTH SOLN
1.0000 [drp] | OPHTHALMIC | Status: DC | PRN
  Filled 2018-07-11: qty 118

## 2018-07-11 MED ORDER — CETIRIZINE HCL 10 MG PO TBDP: 10.0000 mg | ORAL_TABLET | ORAL | 0 refills | Status: AC

## 2018-07-11 MED ORDER — FLUORESCEIN SODIUM 1 MG OP STRP
ORAL_STRIP | OPHTHALMIC | Status: AC
  Filled 2018-07-11: qty 1

## 2018-07-11 NOTE — Discharge Instructions (Signed)
Follow-up with Dr. Murvin Natal who is the ophthalmologist on call.  You will need to call make an appointment at his office.  This is if you continue to have problems with your eye.  Discontinue using erythromycin ophthalmic and begin using the Patanol eyedrops 1 drop each eye twice a day.  Also begin taking Zyrtec 1 daily.  Meloxicam 7.5 mg 1 daily with food.  This is for your chronic back pain.  You may also [facility] which is [facility] until you are able to establish a local doctor.

## 2018-07-11 NOTE — ED Provider Notes (Addendum)
[name] Emergency Department Provider Note  ____________________________________________   First MD Initiated Contact with Patient 07/11/18 [id number]     (approximate)  I have reviewed the triage vital signs and the nursing notes.   HISTORY  Chief Complaint Back Pain and Eye Pain   HPI [name] is a 62 y.o. female presents to the emergency department with complaint of right eye redness that was not improving with the erythromycin ointment prescribed for her.  She states now that the left eye is becoming itchy and involved the same as her right.  Both eyes continue to itch.  She has had some clear drainage.  She denies any sudden change in her vision.  She also complains of chronic back pain for which she has been seen both in [place] and [place].  She states that currently she is trying to get her insurance to pay for someone local.  She denies any injury to her eye.  There is been no urinary symptoms, incontinence, saddle anesthesias or changes in her walking ability.   Past Medical History:  Diagnosis Date  . Chronic low back pain   . COPD (chronic obstructive pulmonary disease) ([place])   . Depression   . Diverticulitis   . GERD (gastroesophageal reflux disease)   . High cholesterol   . Kidney tumor    Small  . Osteoporosis   . UTI (urinary tract infection)    Recurrent    Patient Active Problem List   Diagnosis Date Noted  . SMOKER 03/07/2010  . DYSPNEA 03/07/2010  . CHEST PAIN 03/07/2010  . ELECTROCARDIOGRAM, ABNORMAL 03/07/2010  . BACK PAIN 12/03/2008    Past Surgical History:  Procedure Laterality Date  . APPENDECTOMY    . CYSTECTOMY     [place]  . OVARIAN CYST REMOVAL    . PARTIAL COLECTOMY    . TUBAL LIGATION      Prior to Admission medications   Medication Sig Start Date End Date Taking? Authorizing Provider  aspirin 325 MG tablet Take 325 mg by mouth daily.      [provider]  Cetirizine HCl  (ZYRTEC ALLERGY) 10 MG TBDP Take 10 mg by mouth 1 day or 1 dose for 1 dose. 07/11/18 07/12/18  Johnn Hai, PA-C  meloxicam (MOBIC) 7.5 MG tablet Take 1 tablet (7.5 mg total) by mouth daily. 07/11/18   Johnn Hai, PA-C  naproxen (NAPROSYN) 375 MG tablet Take 375 mg by mouth 2 (two) times daily.      [provider]  olopatadine (PATANOL) 0.1 % ophthalmic solution Place 1 drop into both eyes 2 (two) times daily. 07/11/18   Johnn Hai, PA-C    Allergies Doxycycline and Penicillins  Family History  Problem Relation Age of Onset  . Arthritis Other   . Diabetes Other   . Heart disease Other     Social History Social History   Tobacco Use  . Smoking status: Current Every Day Smoker  . Smokeless tobacco: Never Used  Substance Use Topics  . Alcohol use: Not on file  . Drug use: Not on file    Review of Systems Constitutional: No fever/chills Eyes: No visual changes.  Positive bilateral itching. ENT: No sore throat. Cardiovascular: Denies chest pain. Respiratory: Denies shortness of breath. Gastrointestinal: No abdominal pain.  No nausea, no vomiting.  No diarrhea.  No constipation. Genitourinary: Negative for dysuria. Musculoskeletal: Positive chronic back pain. Skin: Negative for rash. Neurological: Negative for headaches, focal  weakness or numbness. ___________________________________________   PHYSICAL EXAM:  VITAL SIGNS: ED Triage Vitals  Enc Vitals Group     BP 07/11/18 0932 (!) 148/91     Pulse Rate 07/11/18 0932 100     Resp 07/11/18 0932 18     Temp 07/11/18 0932 98.1 F (36.7 C)     Temp Source 07/11/18 0932 Oral     SpO2 07/11/18 0932 99 %     Weight 07/11/18 0931 128 lb 1.4 oz (58.1 kg)     Height 07/11/18 0931 5\' 4"  (1.626 m)     Head Circumference --      Peak Flow --      Pain Score --      Pain Loc --      Pain Edu? --      Excl. in [place]? --    Constitutional: Alert and oriented. Well appearing and in no acute  distress. Eyes: Conjunctivae mildly injected bilaterally.Marland Kitchen PERRL. EOMI. no exudate or discharge is noted at this time. Head: Atraumatic. Nose: No congestion/rhinnorhea. Neck: No stridor.   Cardiovascular: Normal rate, regular rhythm. Grossly normal heart sounds.  Good peripheral circulation. Respiratory: Normal respiratory effort.  No retractions. Lungs CTAB. Musculoskeletal: Examination of the back there is no gross deformity and no point tenderness on palpation.  There is no soft tissue edema or active muscle spasms noted.  Straight leg raises are negative however patient does have discomfort in both her legs with straight leg raises.  Good muscle strength bilaterally.  Patient does walk with a guarded gait. Neurologic:  Normal speech and language. No gross focal neurologic deficits are appreciated.  Reflexes are 2+ bilaterally.   Skin:  Skin is warm, dry and intact.  Periorbital skin is intact.  At this time is erythematous secondary to patient rubbing her eye.  No appreciated edema periorbital area to suggest a cellulitis. Psychiatric: Mood and affect are normal. Speech and behavior are normal.  ____________________________________________   LABS (all labs ordered are listed, but only abnormal results are displayed)  Labs Reviewed - No data to display   PROCEDURES  Procedure(s) performed: None  Procedures  Critical Care performed: No  ____________________________________________   INITIAL IMPRESSION / ASSESSMENT AND PLAN / ED COURSE  As part of my medical decision making, I reviewed the following data within the electronic MEDICAL RECORD NUMBER Notes from prior ED visits and [place] Controlled Substance Database  Patient is here with complaint of chronic back pain and also bilateral eye itching with clear drainage.  Patient was given a prescription for meloxicam 7.5 mg 1 daily with food for her back pain.  She is to establish primary care in this area as soon as her Medicaid "has been  straightened out".  Patient was also given a prescription for Patanol ophthalmic solution 2 to be used in each eye.  She was also given a prescription for Zyrtec 1 daily as needed for itching and allergy symptoms.  Given information [facility]  ____________________________________________   FINAL CLINICAL IMPRESSION(S) / ED DIAGNOSES  Final diagnoses:  Allergic conjunctivitis of both eyes  Chronic bilateral low back pain without sciatica     ED Discharge Orders         Ordered    olopatadine (PATANOL) 0.1 % ophthalmic solution  2 times daily     07/11/18 1034    Cetirizine HCl (ZYRTEC ALLERGY) 10 MG TBDP  1 Day/Dose     07/11/18 1035  meloxicam (MOBIC) 7.5 MG tablet  Daily     07/11/18 1035           Note:  This document was prepared using Dragon voice recognition software and may include unintentional dictation errors.    Johnn Hai, PA-C 07/11/18 1422    Darel Hong, MD 07/11/18 1600    Johnn Hai, PA-C 07/14/18 1058    Darel Hong, MD 07/14/18 [id number]

## 2018-07-11 NOTE — ED Triage Notes (Signed)
Presents with right eye redness and swellng  States she was seen for same but feels like sx's are getting worse  Also having chronic pain to back  Denies any new injury to back

## 2018-09-27 ENCOUNTER — Encounter: Payer: Self-pay | Admitting: *Deleted

## 2018-09-29 ENCOUNTER — Emergency Department: Payer: Medicaid Other

## 2018-09-29 ENCOUNTER — Encounter: Payer: Self-pay | Admitting: Emergency Medicine

## 2018-09-29 ENCOUNTER — Other Ambulatory Visit: Payer: Self-pay

## 2018-09-29 ENCOUNTER — Emergency Department
Admission: EM | Admit: 2018-09-29 | Discharge: 2018-09-29 | Disposition: A | Discharge status: Home / Self Care | Payer: Medicaid Other | Attending: Emergency Medicine | Admitting: Emergency Medicine

## 2018-09-29 DIAGNOSIS — K5901 Slow transit constipation: Secondary | ICD-10-CM | POA: Insufficient documentation

## 2018-09-29 DIAGNOSIS — R109 Unspecified abdominal pain: Secondary | ICD-10-CM

## 2018-09-29 DIAGNOSIS — J449 Chronic obstructive pulmonary disease, unspecified: Secondary | ICD-10-CM | POA: Insufficient documentation

## 2018-09-29 DIAGNOSIS — F17200 Nicotine dependence, unspecified, uncomplicated: Secondary | ICD-10-CM | POA: Insufficient documentation

## 2018-09-29 DIAGNOSIS — Z7982 Long term (current) use of aspirin: Secondary | ICD-10-CM | POA: Insufficient documentation

## 2018-09-29 DIAGNOSIS — Z79899 Other long term (current) drug therapy: Secondary | ICD-10-CM | POA: Diagnosis not present

## 2018-09-29 DIAGNOSIS — R1030 Lower abdominal pain, unspecified: Secondary | ICD-10-CM | POA: Diagnosis present

## 2018-09-29 LAB — URINALYSIS, COMPLETE (UACMP) WITH MICROSCOPIC
BILIRUBIN URINE: NEGATIVE
GLUCOSE, UA: NEGATIVE mg/dL
HGB URINE DIPSTICK: NEGATIVE
Ketones, ur: NEGATIVE mg/dL
Nitrite: NEGATIVE
PROTEIN: NEGATIVE mg/dL
Specific Gravity, Urine: 1.008 (ref 1.005–1.030)
pH: 7 (ref 5.0–8.0)

## 2018-09-29 LAB — COMPREHENSIVE METABOLIC PANEL
ALT: 19 U/L (ref 0–44)
ANION GAP: 10 (ref 5–15)
AST: 26 U/L (ref 15–41)
Albumin: 4.6 g/dL (ref 3.5–5.0)
Alkaline Phosphatase: 96 U/L (ref 38–126)
BUN: 11 mg/dL (ref 8–23)
CHLORIDE: 98 mmol/L (ref 98–111)
CO2: 29 mmol/L (ref 22–32)
Calcium: 9.5 mg/dL (ref 8.9–10.3)
Creatinine, Ser: 0.7 mg/dL (ref 0.44–1.00)
GFR calc Af Amer: >60 mL/min (ref >60)
Glucose, Bld: 106 mg/dL — ABNORMAL HIGH (ref 70–99)
POTASSIUM: 3.8 mmol/L (ref 3.5–5.1)
Sodium: 137 mmol/L (ref 135–145)
TOTAL PROTEIN: 7.8 g/dL (ref 6.5–8.1)
Total Bilirubin: 0.4 mg/dL (ref 0.3–1.2)

## 2018-09-29 LAB — CBC
HEMATOCRIT: 43 % (ref 36.0–46.0)
HEMOGLOBIN: 14.5 g/dL (ref 12.0–15.0)
MCH: 29.7 pg (ref 26.0–34.0)
MCHC: 33.7 g/dL (ref 30.0–36.0)
MCV: 88.1 fL (ref 80.0–100.0)
Platelets: 345 10*3/uL (ref 150–400)
RBC: 4.88 MIL/uL (ref 3.87–5.11)
RDW: 11.9 % (ref 11.5–15.5)
WBC: 10.6 10*3/uL — AB (ref 4.0–10.5)
nRBC: 0 % (ref 0.0–0.2)

## 2018-09-29 LAB — LIPASE, BLOOD: LIPASE: 28 U/L (ref 11–51)

## 2018-09-29 MED ORDER — MAGNESIUM CITRATE PO SOLN
1.0000 | Freq: Once | ORAL | Status: AC
  Administered 2018-09-29: 1 via ORAL
  Filled 2018-09-29: qty 296

## 2018-09-29 MED ORDER — FLEET ENEMA 7-19 GM/118ML RE ENEM
1.0000 | ENEMA | Freq: Once | RECTAL | Status: AC
  Administered 2018-09-29: 1 via RECTAL

## 2018-09-29 MED ORDER — SORBITOL 70 % SOLN
960.0000 mL | TOPICAL_OIL | Freq: Once | ORAL | Status: AC
  Administered 2018-09-29: 960 mL via RECTAL
  Filled 2018-09-29 (×2): qty 473

## 2018-09-29 NOTE — ED Notes (Signed)
Pt stated that she still did not have a bowel movement. Dr. Corky Downs made aware.

## 2018-09-29 NOTE — ED Notes (Signed)
Pt stated that when she used the bathroom, the only thing that came out was the enema. Pt stated that no stool came out. Provider will be made aware.

## 2018-09-29 NOTE — ED Notes (Signed)
Called pharmacy back about enema. Stated they are getting ready to send it now.
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
## 2018-09-29 NOTE — ED Notes (Signed)
Called pharmacy and they will send enema down shortly

## 2018-09-29 NOTE — ED Triage Notes (Signed)
Pt to ED via POV c/o abd pain x 3 days. Pt denies fever. Pt states that she has been constipated and has not had a BM in 3 days.

## 2018-11-04 ENCOUNTER — Ambulatory Visit: Payer: Medicaid Other | Admitting: Gastroenterology

## 2018-11-21 ENCOUNTER — Encounter: Payer: Self-pay | Admitting: Family Medicine

## 2018-11-21 ENCOUNTER — Other Ambulatory Visit: Payer: Self-pay

## 2018-11-21 ENCOUNTER — Emergency Department
Admission: EM | Admit: 2018-11-21 | Discharge: 2018-11-21 | Disposition: A | Discharge status: Home / Self Care | Payer: Medicaid Other | Attending: Pediatrics | Admitting: Pediatrics

## 2018-11-21 ENCOUNTER — Encounter: Payer: Self-pay | Admitting: Emergency Medicine

## 2018-11-21 ENCOUNTER — Emergency Department: Payer: Medicaid Other

## 2018-11-21 ENCOUNTER — Ambulatory Visit: Payer: Medicaid Other | Admitting: Family Medicine

## 2018-11-21 VITALS — BP 130/86 | HR 94 | Temp 98.0°F | Ht 60.75 in | Wt 131.0 lb

## 2018-11-21 DIAGNOSIS — Z7689 Persons encountering health services in other specified circumstances: Secondary | ICD-10-CM | POA: Diagnosis not present

## 2018-11-21 DIAGNOSIS — F17210 Nicotine dependence, cigarettes, uncomplicated: Secondary | ICD-10-CM

## 2018-11-21 DIAGNOSIS — Y9389 Activity, other specified: Secondary | ICD-10-CM | POA: Insufficient documentation

## 2018-11-21 DIAGNOSIS — J449 Chronic obstructive pulmonary disease, unspecified: Secondary | ICD-10-CM | POA: Diagnosis not present

## 2018-11-21 DIAGNOSIS — Z79899 Other long term (current) drug therapy: Secondary | ICD-10-CM | POA: Insufficient documentation

## 2018-11-21 DIAGNOSIS — Y9259 Other trade areas as the place of occurrence of the external cause: Secondary | ICD-10-CM | POA: Insufficient documentation

## 2018-11-21 DIAGNOSIS — S0990XA Unspecified injury of head, initial encounter: Secondary | ICD-10-CM | POA: Insufficient documentation

## 2018-11-21 DIAGNOSIS — Y998 Other external cause status: Secondary | ICD-10-CM | POA: Insufficient documentation

## 2018-11-21 DIAGNOSIS — K5790 Diverticulosis of intestine, part unspecified, without perforation or abscess without bleeding: Secondary | ICD-10-CM | POA: Insufficient documentation

## 2018-11-21 DIAGNOSIS — S0083XA Contusion of other part of head, initial encounter: Secondary | ICD-10-CM | POA: Insufficient documentation

## 2018-11-21 DIAGNOSIS — I10 Essential (primary) hypertension: Secondary | ICD-10-CM | POA: Diagnosis not present

## 2018-11-21 DIAGNOSIS — G894 Chronic pain syndrome: Secondary | ICD-10-CM

## 2018-11-21 MED ORDER — FLUTICASONE-UMECLIDIN-VILANT 100-62.5-25 MCG/INH IN AEPB: 1.0000 | INHALATION_SPRAY | Freq: Every day | RESPIRATORY_TRACT | 6 refills | Status: AC

## 2018-11-21 MED ORDER — ALBUTEROL SULFATE HFA 108 (90 BASE) MCG/ACT IN AERS: 2.0000 | INHALATION_SPRAY | Freq: Four times a day (QID) | RESPIRATORY_TRACT | 0 refills | Status: AC | PRN

## 2018-11-21 MED ORDER — ORPHENADRINE CITRATE ER 100 MG PO TB12: 100.0000 mg | ORAL_TABLET | Freq: Two times a day (BID) | ORAL | 0 refills | Status: AC | PRN

## 2018-11-21 MED ORDER — ORPHENADRINE CITRATE ER 100 MG PO TB12: 100.0000 mg | ORAL_TABLET | Freq: Two times a day (BID) | ORAL | 0 refills | Status: DC | PRN

## 2018-11-21 MED ORDER — ALBUTEROL SULFATE (2.5 MG/3ML) 0.083% IN NEBU: 2.5000 mg | INHALATION_SOLUTION | Freq: Four times a day (QID) | RESPIRATORY_TRACT | 1 refills | Status: AC | PRN
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
## 2018-11-24 DIAGNOSIS — J449 Chronic obstructive pulmonary disease, unspecified: Secondary | ICD-10-CM | POA: Insufficient documentation

## 2018-11-24 NOTE — Assessment & Plan Note (Signed)
Stable but having trouble remembers multiple inhalers daily. Will start trelegy and continue to monitor.

## 2018-11-24 NOTE — Assessment & Plan Note (Signed)
Followed by Pain Management. Continue per their recommendations

## 2018-11-24 NOTE — Assessment & Plan Note (Signed)
Not interested in quitting. Counseling given

## 2018-11-24 NOTE — Assessment & Plan Note (Signed)
Stable and WNL on metoprolol. Continue current regimen

## 2018-11-25 ENCOUNTER — Ambulatory Visit: Payer: Medicaid Other | Admitting: Family Medicine

## 2018-11-26 ENCOUNTER — Telehealth: Payer: Self-pay

## 2018-11-26 NOTE — Telephone Encounter (Signed)
PA approved with medicaid.  Confirmation #: [id number]  Prior Approval #: [id number]

## 2018-12-03 ENCOUNTER — Ambulatory Visit: Payer: Medicaid Other | Admitting: Gastroenterology

## 2018-12-05 ENCOUNTER — Ambulatory Visit: Payer: Medicaid Other | Admitting: Family Medicine

## 2018-12-05 ENCOUNTER — Telehealth: Payer: Self-pay | Admitting: Family Medicine

## 2018-12-05 NOTE — Telephone Encounter (Signed)
PA approved with medicaid.  Confirmation #: [id number]  Prior Approval #: [id number]

## 2018-12-05 NOTE — Telephone Encounter (Signed)
Copied from [place] [id number]. Topic: Quick Communication - See Telephone Encounter >> Dec 05, 2018 10:56 AM Conception Chancy, NT wrote: CRM for notification. See Telephone encounter for: 12/05/18.   Coralee Pesa is calling from [facility] and is needing to know if we received prior authorization for Fluticasone-Umeclidin-Vilant (TRELEGY ELLIPTA) 100-62.5-25 MCG/INH AEPB and if we will pursue it.   Reference key: [id number]  CB# [phone]

## 2018-12-05 NOTE — Telephone Encounter (Signed)
Has there been a prior authorization for trelegy?
